# Patient Record
Sex: Female | Born: 1935 | Race: White | Hispanic: No | Smoking: Current every day smoker
Health system: Southern US, Community
[De-identification: ages and names within clinical notes are randomized; demographics above are authoritative.]

## PROBLEM LIST (undated history)

## (undated) DIAGNOSIS — F329 Major depressive disorder, single episode, unspecified: Secondary | ICD-10-CM

## (undated) DIAGNOSIS — M549 Dorsalgia, unspecified: Secondary | ICD-10-CM

## (undated) DIAGNOSIS — F32A Depression, unspecified: Secondary | ICD-10-CM

## (undated) DIAGNOSIS — M5136 Other intervertebral disc degeneration, lumbar region: Secondary | ICD-10-CM

## (undated) DIAGNOSIS — J449 Chronic obstructive pulmonary disease, unspecified: Secondary | ICD-10-CM

## (undated) DIAGNOSIS — G8929 Other chronic pain: Secondary | ICD-10-CM

## (undated) DIAGNOSIS — M419 Scoliosis, unspecified: Secondary | ICD-10-CM

## (undated) HISTORY — PX: COLONOSCOPY WITH ESOPHAGOGASTRODUODENOSCOPY (EGD) AND ESOPHAGEAL DILATION (ED): SHX6495

## (undated) HISTORY — PX: CERVICAL SPINE SURGERY: SHX589

---

## 2018-01-26 ENCOUNTER — Encounter (HOSPITAL_COMMUNITY): Payer: Self-pay | Admitting: Emergency Medicine

## 2018-01-26 ENCOUNTER — Inpatient Hospital Stay (HOSPITAL_COMMUNITY)
Admission: EM | Admit: 2018-01-26 | Discharge: 2018-02-02 | DRG: 374 | Disposition: A | Discharge status: Skilled Nursing Facility | Payer: Medicare Other | Attending: Internal Medicine | Admitting: Internal Medicine

## 2018-01-26 ENCOUNTER — Emergency Department (HOSPITAL_COMMUNITY): Payer: Medicare Other

## 2018-01-26 ENCOUNTER — Other Ambulatory Visit: Payer: Self-pay

## 2018-01-26 DIAGNOSIS — K222 Esophageal obstruction: Secondary | ICD-10-CM | POA: Diagnosis present

## 2018-01-26 DIAGNOSIS — R6881 Early satiety: Secondary | ICD-10-CM | POA: Diagnosis present

## 2018-01-26 DIAGNOSIS — Z885 Allergy status to narcotic agent status: Secondary | ICD-10-CM

## 2018-01-26 DIAGNOSIS — J441 Chronic obstructive pulmonary disease with (acute) exacerbation: Secondary | ICD-10-CM | POA: Diagnosis present

## 2018-01-26 DIAGNOSIS — J449 Chronic obstructive pulmonary disease, unspecified: Secondary | ICD-10-CM | POA: Insufficient documentation

## 2018-01-26 DIAGNOSIS — R634 Abnormal weight loss: Secondary | ICD-10-CM | POA: Diagnosis present

## 2018-01-26 DIAGNOSIS — R188 Other ascites: Secondary | ICD-10-CM

## 2018-01-26 DIAGNOSIS — R1319 Other dysphagia: Secondary | ICD-10-CM

## 2018-01-26 DIAGNOSIS — R109 Unspecified abdominal pain: Secondary | ICD-10-CM | POA: Diagnosis present

## 2018-01-26 DIAGNOSIS — K319 Disease of stomach and duodenum, unspecified: Secondary | ICD-10-CM | POA: Diagnosis present

## 2018-01-26 DIAGNOSIS — K859 Acute pancreatitis without necrosis or infection, unspecified: Secondary | ICD-10-CM | POA: Diagnosis not present

## 2018-01-26 DIAGNOSIS — C786 Secondary malignant neoplasm of retroperitoneum and peritoneum: Secondary | ICD-10-CM | POA: Diagnosis not present

## 2018-01-26 DIAGNOSIS — G8929 Other chronic pain: Secondary | ICD-10-CM | POA: Diagnosis present

## 2018-01-26 DIAGNOSIS — R809 Proteinuria, unspecified: Secondary | ICD-10-CM | POA: Diagnosis present

## 2018-01-26 DIAGNOSIS — R1314 Dysphagia, pharyngoesophageal phase: Secondary | ICD-10-CM | POA: Diagnosis present

## 2018-01-26 DIAGNOSIS — Z9181 History of falling: Secondary | ICD-10-CM

## 2018-01-26 DIAGNOSIS — K769 Liver disease, unspecified: Secondary | ICD-10-CM | POA: Diagnosis present

## 2018-01-26 DIAGNOSIS — M5136 Other intervertebral disc degeneration, lumbar region: Secondary | ICD-10-CM | POA: Diagnosis present

## 2018-01-26 DIAGNOSIS — R1310 Dysphagia, unspecified: Secondary | ICD-10-CM

## 2018-01-26 DIAGNOSIS — R180 Malignant ascites: Secondary | ICD-10-CM

## 2018-01-26 DIAGNOSIS — E871 Hypo-osmolality and hyponatremia: Secondary | ICD-10-CM | POA: Diagnosis present

## 2018-01-26 DIAGNOSIS — M549 Dorsalgia, unspecified: Secondary | ICD-10-CM

## 2018-01-26 DIAGNOSIS — K5900 Constipation, unspecified: Secondary | ICD-10-CM | POA: Diagnosis present

## 2018-01-26 DIAGNOSIS — C180 Malignant neoplasm of cecum: Secondary | ICD-10-CM | POA: Diagnosis present

## 2018-01-26 DIAGNOSIS — K449 Diaphragmatic hernia without obstruction or gangrene: Secondary | ICD-10-CM | POA: Diagnosis present

## 2018-01-26 DIAGNOSIS — E860 Dehydration: Secondary | ICD-10-CM | POA: Diagnosis present

## 2018-01-26 DIAGNOSIS — Z720 Tobacco use: Secondary | ICD-10-CM | POA: Diagnosis present

## 2018-01-26 DIAGNOSIS — Z79899 Other long term (current) drug therapy: Secondary | ICD-10-CM

## 2018-01-26 DIAGNOSIS — F329 Major depressive disorder, single episode, unspecified: Secondary | ICD-10-CM | POA: Diagnosis present

## 2018-01-26 DIAGNOSIS — F17210 Nicotine dependence, cigarettes, uncomplicated: Secondary | ICD-10-CM | POA: Diagnosis present

## 2018-01-26 DIAGNOSIS — F32A Depression, unspecified: Secondary | ICD-10-CM | POA: Diagnosis present

## 2018-01-26 DIAGNOSIS — C801 Malignant (primary) neoplasm, unspecified: Secondary | ICD-10-CM

## 2018-01-26 HISTORY — DX: Dorsalgia, unspecified: M54.9

## 2018-01-26 HISTORY — DX: Other chronic pain: G89.29

## 2018-01-26 HISTORY — DX: Chronic obstructive pulmonary disease, unspecified: J44.9

## 2018-01-26 HISTORY — DX: Other intervertebral disc degeneration, lumbar region: M51.36

## 2018-01-26 HISTORY — DX: Major depressive disorder, single episode, unspecified: F32.9

## 2018-01-26 HISTORY — DX: Depression, unspecified: F32.A

## 2018-01-26 HISTORY — DX: Scoliosis, unspecified: M41.9

## 2018-01-26 LAB — COMPREHENSIVE METABOLIC PANEL
ALBUMIN: 3 g/dL — AB (ref 3.5–5.0)
ALK PHOS: 82 U/L (ref 38–126)
ALT: 12 U/L (ref 0–44)
AST: 19 U/L (ref 15–41)
Anion gap: 8 (ref 5–15)
BILIRUBIN TOTAL: 0.4 mg/dL (ref 0.3–1.2)
BUN: 20 mg/dL (ref 8–23)
CALCIUM: 9.1 mg/dL (ref 8.9–10.3)
CO2: 30 mmol/L (ref 22–32)
Chloride: 95 mmol/L — ABNORMAL LOW (ref 98–111)
Creatinine, Ser: 0.68 mg/dL (ref 0.44–1.00)
GFR calc Af Amer: >60 mL/min (ref >60)
GFR calc non Af Amer: >60 mL/min (ref >60)
GLUCOSE: 95 mg/dL (ref 70–99)
Potassium: 4.3 mmol/L (ref 3.5–5.1)
Sodium: 133 mmol/L — ABNORMAL LOW (ref 135–145)
TOTAL PROTEIN: 6.8 g/dL (ref 6.5–8.1)

## 2018-01-26 LAB — URINALYSIS, ROUTINE W REFLEX MICROSCOPIC
BACTERIA UA: NONE SEEN
Bilirubin Urine: NEGATIVE
GLUCOSE, UA: NEGATIVE mg/dL
HGB URINE DIPSTICK: NEGATIVE
Ketones, ur: NEGATIVE mg/dL
Leukocytes, UA: NEGATIVE
NITRITE: NEGATIVE
PROTEIN: 30 mg/dL — AB
Specific Gravity, Urine: 1.017 (ref 1.005–1.030)
pH: 7 (ref 5.0–8.0)

## 2018-01-26 LAB — CBC
HCT: 34.2 % — ABNORMAL LOW (ref 36.0–46.0)
HEMOGLOBIN: 11.1 g/dL — AB (ref 12.0–15.0)
MCH: 28.5 pg (ref 26.0–34.0)
MCHC: 32.5 g/dL (ref 30.0–36.0)
MCV: 87.9 fL (ref 78.0–100.0)
Platelets: 553 10*3/uL — ABNORMAL HIGH (ref 150–400)
RBC: 3.89 MIL/uL (ref 3.87–5.11)
RDW: 13.6 % (ref 11.5–15.5)
WBC: 10.5 10*3/uL (ref 4.0–10.5)

## 2018-01-26 LAB — MAGNESIUM: Magnesium: 2.3 mg/dL (ref 1.7–2.4)

## 2018-01-26 LAB — PHOSPHORUS: PHOSPHORUS: 3.4 mg/dL (ref 2.5–4.6)

## 2018-01-26 LAB — LIPASE, BLOOD: Lipase: 263 U/L — ABNORMAL HIGH (ref 11–51)

## 2018-01-26 MED ORDER — MORPHINE SULFATE (PF) 4 MG/ML IV SOLN
INTRAVENOUS | Status: AC
  Filled 2018-01-26: qty 1

## 2018-01-26 MED ORDER — HEPARIN SODIUM (PORCINE) 5000 UNIT/ML IJ SOLN
5000.0000 [IU] | Freq: Three times a day (TID) | INTRAMUSCULAR | Status: DC
  Administered 2018-01-26 – 2018-01-27 (×3): 5000 [IU] via SUBCUTANEOUS
  Filled 2018-01-26 (×3): qty 1

## 2018-01-26 MED ORDER — SODIUM CHLORIDE 0.9 % IV BOLUS
1000.0000 mL | Freq: Once | INTRAVENOUS | Status: AC
  Administered 2018-01-26: 1000 mL via INTRAVENOUS

## 2018-01-26 MED ORDER — ACETAMINOPHEN 325 MG PO TABS: 650.0000 mg | ORAL_TABLET | Freq: Four times a day (QID) | ORAL | Status: DC | PRN

## 2018-01-26 MED ORDER — IPRATROPIUM-ALBUTEROL 0.5-2.5 (3) MG/3ML IN SOLN
3.0000 mL | Freq: Once | RESPIRATORY_TRACT | Status: AC
  Administered 2018-01-26: 3 mL via RESPIRATORY_TRACT
  Filled 2018-01-26: qty 3

## 2018-01-26 MED ORDER — MORPHINE SULFATE (PF) 2 MG/ML IV SOLN
2.0000 mg | INTRAVENOUS | Status: DC | PRN
  Administered 2018-01-27 – 2018-01-31 (×13): 2 mg via INTRAVENOUS
  Filled 2018-01-26 (×14): qty 1

## 2018-01-26 MED ORDER — FAMOTIDINE IN NACL 20-0.9 MG/50ML-% IV SOLN
20.0000 mg | Freq: Two times a day (BID) | INTRAVENOUS | Status: DC
  Administered 2018-01-26 – 2018-01-28 (×4): 20 mg via INTRAVENOUS
  Filled 2018-01-26 (×4): qty 50

## 2018-01-26 MED ORDER — SODIUM CHLORIDE 0.9 % IV SOLN
INTRAVENOUS | Status: DC
  Administered 2018-01-26 – 2018-01-30 (×7): via INTRAVENOUS

## 2018-01-26 MED ORDER — METHYLPREDNISOLONE SODIUM SUCC 40 MG IJ SOLR
40.0000 mg | Freq: Once | INTRAMUSCULAR | Status: AC
  Administered 2018-01-27: 40 mg via INTRAVENOUS
  Filled 2018-01-26: qty 1

## 2018-01-26 MED ORDER — IOPAMIDOL (ISOVUE-300) INJECTION 61%
100.0000 mL | Freq: Once | INTRAVENOUS | Status: AC | PRN
  Administered 2018-01-26: 100 mL via INTRAVENOUS

## 2018-01-26 MED ORDER — ONDANSETRON HCL 4 MG/2ML IJ SOLN
4.0000 mg | Freq: Once | INTRAMUSCULAR | Status: AC
Start: 2018-01-26 — End: 2018-01-26
  Administered 2018-01-26: 4 mg via INTRAVENOUS
  Filled 2018-01-26: qty 2

## 2018-01-26 MED ORDER — MORPHINE SULFATE (PF) 4 MG/ML IV SOLN
4.0000 mg | Freq: Once | INTRAVENOUS | Status: AC
  Administered 2018-01-26: 4 mg via INTRAVENOUS
  Filled 2018-01-26: qty 1

## 2018-01-26 MED ORDER — ONDANSETRON HCL 4 MG/2ML IJ SOLN: 4.0000 mg | Freq: Three times a day (TID) | INTRAMUSCULAR | Status: DC | PRN

## 2018-01-26 MED ORDER — ACETAMINOPHEN 650 MG RE SUPP: 650.0000 mg | Freq: Four times a day (QID) | RECTAL | Status: DC | PRN

## 2018-01-26 NOTE — ED Provider Notes (Signed)
[facility] EMERGENCY DEPARTMENT Provider Note   CSN: [id number] Arrival date & time: 01/26/18  1421     History   Chief Complaint Chief Complaint  Patient presents with  . Abdominal Pain    HPI Christy Valdez is a 82 y.o. female.  She is complaining of 1 month of poor appetite and weight loss.  Her daughter states she is lost about 20 pounds in the month.  Over the last 1 week she is had central abdominal pain with indigestion.  She rates the pain as 9 out of 10 at its usually more right upper although it is diffuse and radiates into her back.  She is had one episode of vomiting and her stools are usually more constipated and she is been taking laxatives for that.  She has not noticed blood from either end.  She is got a chronic cough from COPD and continues to smoke.  She denies any chest pain and is been no urinary symptoms.  She denies any alcohol use.  The history is provided by the patient and a relative.  Abdominal Pain   This is a new problem. The current episode started more than 1 week ago. The problem occurs constantly. The problem has not changed since onset.The pain is associated with eating. The pain is located in the generalized abdominal region and RUQ. The quality of the pain is aching. The pain is at a severity of 9/10. Associated symptoms include anorexia, belching, nausea, vomiting and constipation. Pertinent negatives include fever, diarrhea, flatus, hematochezia, melena, dysuria, frequency, hematuria and headaches. The symptoms are aggravated by eating. Nothing relieves the symptoms.    Past Medical History:  Diagnosis Date  . Chronic back pain   . COPD (chronic obstructive pulmonary disease) ([place])   . DDD (degenerative disc disease), lumbar   . Depression   . Scoliosis     There are no active problems to display for this patient.   History reviewed. No pertinent surgical history.   OB History   None      Home Medications    Prior to Admission  medications   Not on File    Family History History reviewed. No pertinent family history.  Social History Social History   Tobacco Use  . Smoking status: Current Every Day Smoker    Packs/day: 1.00    Types: Cigarettes  . Smokeless tobacco: Never Used  Substance Use Topics  . Alcohol use: Never    Frequency: Never  . Drug use: Never     Allergies   Patient has no known allergies.   Review of Systems Review of Systems  Constitutional: Negative for fever.  HENT: Negative for sore throat.   Eyes: Negative for visual disturbance.  Respiratory: Negative for shortness of breath.   Cardiovascular: Negative for chest pain.  Gastrointestinal: Positive for abdominal pain, anorexia, constipation, nausea and vomiting. Negative for diarrhea, flatus, hematochezia and melena.  Genitourinary: Negative for dysuria, frequency and hematuria.  Musculoskeletal: Positive for back pain. Negative for neck pain.  Skin: Negative for rash.  Neurological: Negative for headaches.     Physical Exam Updated Vital Signs BP (!) 190/83 (BP Location: Left Arm)   Pulse 68   Temp 98.4 F (36.9 C) (Oral)   Resp 16   Ht 5\' 2"  (1.575 m)   Wt 36.3 kg (80 lb)   SpO2 98%   BMI 14.63 kg/m   Physical Exam  Constitutional: She appears well-developed and well-nourished. No distress.  HENT:  Head:  Normocephalic and atraumatic.  Eyes: Conjunctivae are normal.  Neck: Neck supple.  Cardiovascular: Normal rate, regular rhythm, normal heart sounds and intact distal pulses.  No murmur heard. Pulmonary/Chest: Effort normal and breath sounds normal. No respiratory distress.  Abdominal: Soft. There is generalized tenderness and tenderness in the right upper quadrant. There is no rigidity and no guarding.  Musculoskeletal: She exhibits no edema, tenderness or deformity.  Neurological: She is alert.  Skin: Skin is warm and dry. Capillary refill takes less than 2 seconds.  Psychiatric: She has a normal mood  and affect.  Nursing note and vitals reviewed.    ED Treatments / Results  Labs (all labs ordered are listed, but only abnormal results are displayed) Labs Reviewed  URINALYSIS, ROUTINE W REFLEX MICROSCOPIC - Abnormal; Notable for the following components:      Result Value   Protein, ur 30 (*)    All other components within normal limits  CBC - Abnormal; Notable for the following components:   Hemoglobin 11.1 (*)    HCT 34.2 (*)    Platelets 553 (*)    All other components within normal limits  COMPREHENSIVE METABOLIC PANEL - Abnormal; Notable for the following components:   Sodium 133 (*)    Chloride 95 (*)    Albumin 3.0 (*)    All other components within normal limits  LIPASE, BLOOD - Abnormal; Notable for the following components:   Lipase 263 (*)    All other components within normal limits    EKG EKG Interpretation  Date/Time:  Monday January 26 2018 22:39:48 EDT Ventricular Rate:  71 PR Interval:    QRS Duration: 83 QT Interval:  377 QTC Calculation: 410 R Axis:   74 Text Interpretation:  Sinus rhythm Borderline repolarization abnormality no prior to compare with Confirmed by Aletta Edouard [phone]) on 01/26/2018 10:53:27 PM   Radiology Ct Abdomen Pelvis W Contrast  Result Date: 01/26/2018 CLINICAL DATA:  20 pound weight loss over the last month. Central abdominal pain and digestion. EXAM: CT ABDOMEN AND PELVIS WITH CONTRAST TECHNIQUE: Multidetector CT imaging of the abdomen and pelvis was performed using the standard protocol following bolus administration of intravenous contrast. CONTRAST:  1101mL ISOVUE-300 IOPAMIDOL (ISOVUE-300) INJECTION 61% COMPARISON:  None. FINDINGS: Lower chest: Small to moderate-sized hiatal hernia.  Cachexia. Hepatobiliary: 4 mm hypodense lesion in the lateral segment left hepatic lobe on image 13/2. 6 mm hypodense lesion in the right hepatic lobe on image 18/2. Probable tiny gallstone in the gallbladder, 2 mm in diameter on image 26/2.  Borderline gallbladder wall thickening. Mild periportal edema.  No biliary dilatation. Pancreas: Upper normal caliber of the dorsal pancreatic duct. Otherwise unremarkable. Spleen: Unremarkable Adrenals/Urinary Tract: Adrenal glands unremarkable. 7 mm hypodense lesion of the right mid kidney, likely a benign cyst but technically too small to characterize. No appreciable hydronephrosis or urinary tract calculus. Stomach/Bowel: The bowel is suspended in prominent ascites. Prominent wall thickening in the sigmoid colon, tumor not excluded. Vascular/Lymphatic: Aortoiliac atherosclerotic vascular disease. Reproductive: Ill definition of the uterus, query hysterectomy. Ovaries indistinct. Other: Prominent ascites. Enhancing and some calcified margins of the pelvic portion of the ascites posteriorly for example on images 55-61 of series 2. Suspected tumor deposition along the left upper quadrant omentum. Probable thick tumor throughout the transverse colon and splenic flexure mesentery. Musculoskeletal: Dextroconvex lumbar scoliosis. Lumbar spondylosis and degenerative disc disease including a notable disc protrusion at the L3-4 level. Degenerative arthropathy of both hips. IMPRESSION: 1. Prominent ascites with enhancing margins especially  posteriorly in the pelvis, and suspected omental and mesenteric caking of tumor in the left upper quadrant and along the transverse colon mesentery. Appearance highly suspicious for malignancy, common etiologies would be from mucinous colon cancer or ovarian cancer. The ovaries are poorly visualized. Peritonitis is a less likely differential diagnostic consideration given the overall appearance. Correlate with serologic tumor markers. 2. Marked wall thickening in the sigmoid colon could be from diverticulosis or tumor. 3. Cachexia. 4. Other imaging findings of potential clinical significance: Small to moderate-sized hiatal hernia. Possible gallstone. Several tiny hepatic lesions are  technically nonspecific. Mild periportal edema. Aortic Atherosclerosis (ICD10-I70.0). Dextroconvex lumbar scoliosis. Lumbar spondylosis and degenerative disc disease. Electronically Signed   By: Van Clines M.D.   On: 01/26/2018 21:38   US Abdomen Limited Ruq  Result Date: 01/27/2018 CLINICAL DATA:  Pancreatitis, 20 pounds weight loss EXAM: ULTRASOUND ABDOMEN LIMITED RIGHT UPPER QUADRANT COMPARISON:  CT abdomen and pelvis 01/26/2018 FINDINGS: Gallbladder: Upper normal gallbladder wall thickness. Tiny probable gallbladder polyp 3 mm diameter. No definite shadowing calculi, wall thickening, or sonographic Murphy sign. Common bile duct: Diameter: 4 mm diameter, normal Liver: Normal echogenicity. No definite hepatic mass or nodularity. Portal vein is patent on color Doppler imaging with normal direction of blood flow towards the liver. Significant ascites. Noted RIGHT pleural effusion. IMPRESSION: Significant ascites and small RIGHT pleural effusion. Tiny gallbladder polyp 3 mm diameter without definite gallstones or biliary dilatation. Electronically Signed   By: Lavonia Dana M.D.   On: 01/27/2018 10:28    Procedures Procedures (including critical care time)  Medications Ordered in ED Medications - No data to display   Initial Impression / Assessment and Plan / ED Course  I have reviewed the triage vital signs and the nursing notes.  Pertinent labs & imaging results that were available during my care of the patient were reviewed by me and considered in my medical decision making (see chart for details).  Clinical Course as of Jan 27 1045  Mon Jul 15, 368  [id number] 82 year old female with 1 month of decreased appetite and weight loss.  1 week of diffuse abdominal pain.  Here with elevated lipase of 263.  The rest of her lab work looks fairly benign right now.  She is pending a CT abdomen.   [MB]    Clinical Course User Index [MB] Hayden Rasmussen, MD   Patient CT was concerning for  possible carcinomatosis in the setting of ascites.  Due to her pain and her inability to take good p.o. I felt that she should be admitted to the hospital for management of these.  I reviewed this with the patient and let her know my concerns about the CAT scan findings.  I discussed this also with Dr. Olevia Bowens from the hospitalist service who will admit her to his service. Final Clinical Impressions(s) / ED Diagnoses   Final diagnoses:  Other ascites  Acute pancreatitis without infection or necrosis, unspecified pancreatitis type  Loss of weight    ED Discharge Orders    None       Hayden Rasmussen, MD 01/27/18 1050

## 2018-01-26 NOTE — ED Triage Notes (Signed)
Patient complaining of abdominal pain "for over a week." States she has been constipated and only had small bowel movements in the past week.

## 2018-01-26 NOTE — H&P (Signed)
History and Physical    Christy Valdez WLN:[id number] DOB: 05-12-1936 DOA: 01/26/2018  PCP: Quentin Cornwall, MD   Patient coming from: Home.  I have personally briefly reviewed patient's old medical records in [place]  Chief Complaint: Abdominal pain.  HPI: Christy Valdez is a 82 y.o. female with medical history significant of chronic back pain, COPD, scoliosis, lumbar DDD, depression who is coming to the emergency department with complaints of abdominal pain for the past 3 weeks.  She states that she had one episode of vomiting last week.  She states that she lost 20 pounds in the past 2 years and her poor appetite also started 2 years ago, however the patient's daughter stated earlier to Dr. Melina Copa that she has had very poor appetite and lost 20 pounds in the past month.  She has been having trouble with constipation and her last bowel movement was yesterday.  She has to use over-the-counter laxatives to induce BM.  She denies diarrhea, melena or hematochezia.  No dysuria, frequency or hematuria.  She also has had cough, wheezing and dyspnea recently.  Denies fever, chills, sore throat, hemoptysis, chest pain, palpitations, dizziness, diaphoresis, PND, orthopnea or pitting edema lower extremities.  No heat or cold intolerance.  No polyuria, polydipsia, polyphagia or blurred vision.  Denies skin pruritus or rashes.  ED Course: She will vital signs temperature 98.4 F, pulse 103, respirations 18, blood pressure 152/81 mmHg and O2 sat 93% on room air.  The patient received 4 mg of morphine IVP x1 and ondansetron 4 mg IVP x1.  She is stated that her pain has decreased from 9 out of 10 to 5 out of 10.  When asked, the patient declined further analgesic treatment at that time.  Work-up shows a urinalysis with proteinuria of 30 mg/dL, but no other finding.  Her white count was 10.5, hemoglobin 11.1 g/dL and platelets 553.  Sodium is 133 and chloride 95 mmol/L.  Her albumin is 3.0 g/dL.  All  other CMP values are within normal limits.  Her lipase was elevated at 263 units/L.  Imaging: CT abdomen/pelvis with contrast showed prominent mastitis with enhancing margins especially posteriorly in the pelvis, and suspecting omental and mesenteric caking of tumor in the left upper quadrant and along the transverse colon mesentery.  This is highly suspicious for malignancy and common etiologies would be mucinous colon CA or ovarian cyst cancer.  Ovaries are poorly visualized.  Peritonitis is less likely differential diagnosis given the overall appearance.  There was a possible gallstone.  Correlate with serologic tumor markers.  Review of Systems: As per HPI otherwise 10 point review of systems negative.   Past Medical History:  Diagnosis Date  . Chronic back pain   . COPD (chronic obstructive pulmonary disease) ([place])   . DDD (degenerative disc disease), lumbar   . Depression   . Scoliosis     History reviewed. No pertinent surgical history.   reports that she has been smoking cigarettes.  She has been smoking about 1.00 pack per day. She has never used smokeless tobacco. She reports that she does not drink alcohol or use drugs.  Allergies  Allergen Reactions  . Codeine Nausea And Vomiting   Family History  Problem Relation Age of Onset  . AAA (abdominal aortic aneurysm) Mother   . CVA Father   . Cancer Sister        Unspecified type.  Marland Kitchen CAD Brother        Died of  MI  . Breast cancer Sister   . CAD Brother     Prior to Admission medications   Medication Sig Start Date End Date Taking? Authorizing Provider  albuterol (PROVENTIL) (2.5 MG/3ML) 0.083% nebulizer solution Take 2.5 mg by nebulization every 6 (six) hours as needed for wheezing or shortness of breath.  11/02/17  Yes [provider]  amoxicillin (AMOXIL) 500 MG capsule TAKE ONE CAPSULE BY MOUTH TWICE DAILY FOR 10 DAYS 01/20/18  Yes [provider]  citalopram (CELEXA) 40 MG tablet Take 40 mg by mouth  daily.  12/09/17  Yes [provider]  pramipexole (MIRAPEX) 0.125 MG tablet Take 0.125 mg by mouth at bedtime.  11/02/17  Yes [provider]  traMADol (ULTRAM) 50 MG tablet Take 50 mg by mouth 3 (three) times daily as needed. FOR PAIN 12/09/17  Yes [provider]  SPIRIVA HANDIHALER 18 MCG inhalation capsule Place 18 mcg into inhaler and inhale daily.  11/04/17   [provider]    Physical Exam: Vitals:   01/26/18 1930 01/26/18 2000 01/26/18 2100 01/26/18 2257  BP: (!) 170/69 (!) 195/70 (!) 183/97 (!) 170/86  Pulse: 64 68 72   Resp:    16  Temp:      TempSrc:      SpO2: 95% (!) 89% 94% 92%  Weight:      Height:        Constitutional: Cachectic, but in NAD, calm, comfortable Eyes: PERRL, lids and conjunctivae normal ENMT: Mucous membranes are moist. Posterior pharynx clear of any exudate or lesions. Neck: normal, supple, no masses, no thyromegaly Respiratory: Decreased breath sounds bilaterally with wheezing and mild rhonchi, no crackles. Normal respiratory effort. No accessory muscle use.  Cardiovascular: Regular rate and rhythm, no murmurs / rubs / gallops. No extremity edema. 2+ pedal pulses. No carotid bruits.  Abdomen: Abdomen feels tense, but not distended.  Mildly tympanic.  Positive epigastric, RUQ, RLQ and suprapubic tenderness, no guarding/rebound/masses palpated. No hepatosplenomegaly. Bowel sounds positive.  Musculoskeletal: no clubbing / cyanosis. Good ROM, no contractures. Normal muscle tone.  Skin: Positive ecchymosis areas on forearms.  Positive skin telangiectasis on ankles and pretibial area. Neurologic: CN 2-12 grossly intact. Sensation intact, DTR normal. Strength 5/5 in all 4.  Psychiatric: Alert and oriented x 3. Normal mood.    Labs on Admission: I have personally reviewed following labs and imaging studies  CBC: Recent Labs  Lab 01/26/18 1645  WBC 10.5  HGB 11.1*  HCT 34.2*  MCV 87.9  PLT 010*   Basic Metabolic  Panel: Recent Labs  Lab 01/26/18 1645  NA 133*  K 4.3  CL 95*  CO2 30  GLUCOSE 95  BUN 20  CREATININE 0.68  CALCIUM 9.1  MG 2.3  PHOS 3.4   GFR: Estimated Creatinine Clearance: 32.1 mL/min (by C-G formula based on SCr of 0.68 mg/dL). Liver Function Tests: Recent Labs  Lab 01/26/18 1645  AST 19  ALT 12  ALKPHOS 82  BILITOT 0.4  PROT 6.8  ALBUMIN 3.0*   Recent Labs  Lab 01/26/18 1645  LIPASE 263*   No results for input(s): AMMONIA in the last 168 hours. Coagulation Profile: No results for input(s): INR, PROTIME in the last 168 hours. Cardiac Enzymes: No results for input(s): CKTOTAL, CKMB, CKMBINDEX, TROPONINI in the last 168 hours. BNP (last 3 results) No results for input(s): PROBNP in the last 8760 hours. HbA1C: No results for input(s): HGBA1C in the last 72 hours. CBG: No results for input(s):  GLUCAP in the last 168 hours. Lipid Profile: No results for input(s): CHOL, HDL, LDLCALC, TRIG, CHOLHDL, LDLDIRECT in the last 72 hours. Thyroid Function Tests: No results for input(s): TSH, T4TOTAL, FREET4, T3FREE, THYROIDAB in the last 72 hours. Anemia Panel: No results for input(s): VITAMINB12, FOLATE, FERRITIN, TIBC, IRON, RETICCTPCT in the last 72 hours. Urine analysis:    Component Value Date/Time   COLORURINE YELLOW 01/26/2018 1430   APPEARANCEUR CLEAR 01/26/2018 1430   LABSPEC 1.017 01/26/2018 1430   PHURINE 7.0 01/26/2018 1430   GLUCOSEU NEGATIVE 01/26/2018 1430   HGBUR NEGATIVE 01/26/2018 1430   BILIRUBINUR NEGATIVE 01/26/2018 1430   KETONESUR NEGATIVE 01/26/2018 1430   PROTEINUR 30 (A) 01/26/2018 1430   NITRITE NEGATIVE 01/26/2018 1430   LEUKOCYTESUR NEGATIVE 01/26/2018 1430    Radiological Exams on Admission: Ct Abdomen Pelvis W Contrast  Result Date: 01/26/2018 CLINICAL DATA:  20 pound weight loss over the last month. Central abdominal pain and digestion. EXAM: CT ABDOMEN AND PELVIS WITH CONTRAST TECHNIQUE: Multidetector CT imaging of the  abdomen and pelvis was performed using the standard protocol following bolus administration of intravenous contrast. CONTRAST:  169mL ISOVUE-300 IOPAMIDOL (ISOVUE-300) INJECTION 61% COMPARISON:  None. FINDINGS: Lower chest: Small to moderate-sized hiatal hernia.  Cachexia. Hepatobiliary: 4 mm hypodense lesion in the lateral segment left hepatic lobe on image 13/2. 6 mm hypodense lesion in the right hepatic lobe on image 18/2. Probable tiny gallstone in the gallbladder, 2 mm in diameter on image 26/2. Borderline gallbladder wall thickening. Mild periportal edema.  No biliary dilatation. Pancreas: Upper normal caliber of the dorsal pancreatic duct. Otherwise unremarkable. Spleen: Unremarkable Adrenals/Urinary Tract: Adrenal glands unremarkable. 7 mm hypodense lesion of the right mid kidney, likely a benign cyst but technically too small to characterize. No appreciable hydronephrosis or urinary tract calculus. Stomach/Bowel: The bowel is suspended in prominent ascites. Prominent wall thickening in the sigmoid colon, tumor not excluded. Vascular/Lymphatic: Aortoiliac atherosclerotic vascular disease. Reproductive: Ill definition of the uterus, query hysterectomy. Ovaries indistinct. Other: Prominent ascites. Enhancing and some calcified margins of the pelvic portion of the ascites posteriorly for example on images 55-61 of series 2. Suspected tumor deposition along the left upper quadrant omentum. Probable thick tumor throughout the transverse colon and splenic flexure mesentery. Musculoskeletal: Dextroconvex lumbar scoliosis. Lumbar spondylosis and degenerative disc disease including a notable disc protrusion at the L3-4 level. Degenerative arthropathy of both hips. IMPRESSION: 1. Prominent ascites with enhancing margins especially posteriorly in the pelvis, and suspected omental and mesenteric caking of tumor in the left upper quadrant and along the transverse colon mesentery. Appearance highly suspicious for  malignancy, common etiologies would be from mucinous colon cancer or ovarian cancer. The ovaries are poorly visualized. Peritonitis is a less likely differential diagnostic consideration given the overall appearance. Correlate with serologic tumor markers. 2. Marked wall thickening in the sigmoid colon could be from diverticulosis or tumor. 3. Cachexia. 4. Other imaging findings of potential clinical significance: Small to moderate-sized hiatal hernia. Possible gallstone. Several tiny hepatic lesions are technically nonspecific. Mild periportal edema. Aortic Atherosclerosis (ICD10-I70.0). Dextroconvex lumbar scoliosis. Lumbar spondylosis and degenerative disc disease. Electronically Signed   By: Van Clines M.D.   On: 01/26/2018 21:38    EKG: Independently reviewed.  Vent. rate 71 BPM PR interval * ms QRS duration 83 ms QT/QTc 377/410 ms P-R-T axes 78 74 91 Sinus rhythm Borderline repolarization abnormality. No previous tracing to compare to.  Assessment/Plan Principal Problem:   Intractable abdominal pain Observation/MedSurg. Continue IV fluids. Continue antiemetic  as needed. Continue analgesics as needed. Check CEA and CA 125 levels. Consult oncology in a.m. May need IR for tissue and/or fluid sampling.  Active Problems:   Acute pancreatitis Continue management as above. Check lipase level in AM. Check RUQ [place] in the morning.    Chronic back pain Hold tramadol while getting parenteral analgesics.    COPD exacerbation ([place]) Started on supplemental oxygen. DuoNeb every 6 hours. Single dose Solu-Medrol 40 mg IVP given.    Depression Continue citalopram 40 mg p.o. daily.    Tobacco use Declined nicotine replacement therapy. Staff to provide smoking cessation information.    Hyponatremia Continue normal saline infusion. Follow-up sodium level.    DVT prophylaxis: Heparin SQ. Code Status: Full code. Family Communication: Disposition Plan: Observation for pain  control and further work-up. Consults called: Routine oncology consult Admission status: Observation/MedSurg.   Reubin Milan MD [facility] Pager [phone].  If 7PM-7AM, please contact night-coverage [email or web address] Password [facility]  01/26/2018, 11:05 PM

## 2018-01-26 NOTE — ED Notes (Signed)
EDP at bedside  

## 2018-01-27 ENCOUNTER — Encounter (HOSPITAL_COMMUNITY): Payer: Self-pay | Admitting: Gastroenterology

## 2018-01-27 ENCOUNTER — Observation Stay (HOSPITAL_COMMUNITY): Payer: Medicare Other

## 2018-01-27 DIAGNOSIS — Z720 Tobacco use: Secondary | ICD-10-CM | POA: Diagnosis not present

## 2018-01-27 DIAGNOSIS — R1310 Dysphagia, unspecified: Secondary | ICD-10-CM | POA: Diagnosis not present

## 2018-01-27 DIAGNOSIS — R180 Malignant ascites: Secondary | ICD-10-CM | POA: Diagnosis present

## 2018-01-27 DIAGNOSIS — Z885 Allergy status to narcotic agent status: Secondary | ICD-10-CM | POA: Diagnosis not present

## 2018-01-27 DIAGNOSIS — C801 Malignant (primary) neoplasm, unspecified: Secondary | ICD-10-CM | POA: Diagnosis not present

## 2018-01-27 DIAGNOSIS — R1314 Dysphagia, pharyngoesophageal phase: Secondary | ICD-10-CM | POA: Diagnosis present

## 2018-01-27 DIAGNOSIS — C180 Malignant neoplasm of cecum: Secondary | ICD-10-CM | POA: Diagnosis present

## 2018-01-27 DIAGNOSIS — M5136 Other intervertebral disc degeneration, lumbar region: Secondary | ICD-10-CM | POA: Diagnosis present

## 2018-01-27 DIAGNOSIS — R109 Unspecified abdominal pain: Secondary | ICD-10-CM | POA: Diagnosis not present

## 2018-01-27 DIAGNOSIS — R188 Other ascites: Secondary | ICD-10-CM | POA: Diagnosis not present

## 2018-01-27 DIAGNOSIS — K222 Esophageal obstruction: Secondary | ICD-10-CM | POA: Diagnosis present

## 2018-01-27 DIAGNOSIS — F17210 Nicotine dependence, cigarettes, uncomplicated: Secondary | ICD-10-CM | POA: Diagnosis present

## 2018-01-27 DIAGNOSIS — K449 Diaphragmatic hernia without obstruction or gangrene: Secondary | ICD-10-CM | POA: Diagnosis present

## 2018-01-27 DIAGNOSIS — Z79899 Other long term (current) drug therapy: Secondary | ICD-10-CM | POA: Diagnosis not present

## 2018-01-27 DIAGNOSIS — R634 Abnormal weight loss: Secondary | ICD-10-CM | POA: Diagnosis not present

## 2018-01-27 DIAGNOSIS — C786 Secondary malignant neoplasm of retroperitoneum and peritoneum: Secondary | ICD-10-CM | POA: Diagnosis not present

## 2018-01-27 DIAGNOSIS — K859 Acute pancreatitis without necrosis or infection, unspecified: Secondary | ICD-10-CM | POA: Diagnosis present

## 2018-01-27 DIAGNOSIS — K5900 Constipation, unspecified: Secondary | ICD-10-CM | POA: Diagnosis present

## 2018-01-27 DIAGNOSIS — G8929 Other chronic pain: Secondary | ICD-10-CM | POA: Diagnosis present

## 2018-01-27 DIAGNOSIS — J441 Chronic obstructive pulmonary disease with (acute) exacerbation: Secondary | ICD-10-CM | POA: Diagnosis present

## 2018-01-27 DIAGNOSIS — K769 Liver disease, unspecified: Secondary | ICD-10-CM | POA: Diagnosis present

## 2018-01-27 DIAGNOSIS — Z9181 History of falling: Secondary | ICD-10-CM | POA: Diagnosis not present

## 2018-01-27 DIAGNOSIS — E860 Dehydration: Secondary | ICD-10-CM | POA: Diagnosis present

## 2018-01-27 DIAGNOSIS — F329 Major depressive disorder, single episode, unspecified: Secondary | ICD-10-CM | POA: Diagnosis present

## 2018-01-27 DIAGNOSIS — R6881 Early satiety: Secondary | ICD-10-CM | POA: Diagnosis present

## 2018-01-27 DIAGNOSIS — K319 Disease of stomach and duodenum, unspecified: Secondary | ICD-10-CM | POA: Diagnosis present

## 2018-01-27 DIAGNOSIS — R809 Proteinuria, unspecified: Secondary | ICD-10-CM | POA: Diagnosis present

## 2018-01-27 DIAGNOSIS — E871 Hypo-osmolality and hyponatremia: Secondary | ICD-10-CM | POA: Diagnosis present

## 2018-01-27 LAB — CBC WITH DIFFERENTIAL/PLATELET
BASOS ABS: 0 10*3/uL (ref 0.0–0.1)
BASOS PCT: 0 %
EOS ABS: 0 10*3/uL (ref 0.0–0.7)
EOS PCT: 0 %
HEMATOCRIT: 34.5 % — AB (ref 36.0–46.0)
Hemoglobin: 10.8 g/dL — ABNORMAL LOW (ref 12.0–15.0)
LYMPHS PCT: 6 %
Lymphs Abs: 0.6 10*3/uL — ABNORMAL LOW (ref 0.7–4.0)
MCH: 27.7 pg (ref 26.0–34.0)
MCHC: 31.3 g/dL (ref 30.0–36.0)
MCV: 88.5 fL (ref 78.0–100.0)
MONO ABS: 0.1 10*3/uL (ref 0.1–1.0)
Monocytes Relative: 1 %
Neutro Abs: 8.9 10*3/uL — ABNORMAL HIGH (ref 1.7–7.7)
Neutrophils Relative %: 93 %
Platelets: 516 10*3/uL — ABNORMAL HIGH (ref 150–400)
RBC: 3.9 MIL/uL (ref 3.87–5.11)
RDW: 13.6 % (ref 11.5–15.5)
WBC: 9.5 10*3/uL (ref 4.0–10.5)

## 2018-01-27 LAB — COMPREHENSIVE METABOLIC PANEL
ALBUMIN: 2.5 g/dL — AB (ref 3.5–5.0)
ALK PHOS: 77 U/L (ref 38–126)
ALT: 10 U/L (ref 0–44)
ANION GAP: 8 (ref 5–15)
AST: 16 U/L (ref 15–41)
BILIRUBIN TOTAL: 0.3 mg/dL (ref 0.3–1.2)
BUN: 15 mg/dL (ref 8–23)
CO2: 28 mmol/L (ref 22–32)
CREATININE: 0.58 mg/dL (ref 0.44–1.00)
Calcium: 8.4 mg/dL — ABNORMAL LOW (ref 8.9–10.3)
Chloride: 100 mmol/L (ref 98–111)
GFR calc non Af Amer: >60 mL/min (ref >60)
GLUCOSE: 103 mg/dL — AB (ref 70–99)
Potassium: 4.7 mmol/L (ref 3.5–5.1)
SODIUM: 136 mmol/L (ref 135–145)
TOTAL PROTEIN: 5.8 g/dL — AB (ref 6.5–8.1)

## 2018-01-27 LAB — LIPASE, BLOOD: Lipase: 66 U/L — ABNORMAL HIGH (ref 11–51)

## 2018-01-27 MED ORDER — ENSURE ENLIVE PO LIQD: 237.0000 mL | Freq: Four times a day (QID) | ORAL | Status: DC

## 2018-01-27 MED ORDER — CITALOPRAM HYDROBROMIDE 20 MG PO TABS
40.0000 mg | ORAL_TABLET | Freq: Every day | ORAL | Status: DC
  Administered 2018-01-27 – 2018-02-02 (×7): 40 mg via ORAL
  Filled 2018-01-27 (×7): qty 2

## 2018-01-27 MED ORDER — BOOST / RESOURCE BREEZE PO LIQD CUSTOM
1.0000 | Freq: Three times a day (TID) | ORAL | Status: DC
  Administered 2018-01-27: 1 via ORAL

## 2018-01-27 MED ORDER — TIOTROPIUM BROMIDE MONOHYDRATE 18 MCG IN CAPS
18.0000 ug | ORAL_CAPSULE | Freq: Every day | RESPIRATORY_TRACT | Status: DC
  Administered 2018-01-27 – 2018-01-31 (×5): 18 ug via RESPIRATORY_TRACT
  Filled 2018-01-27: qty 5

## 2018-01-27 MED ORDER — BOOST / RESOURCE BREEZE PO LIQD CUSTOM
1.0000 | Freq: Three times a day (TID) | ORAL | Status: DC
  Administered 2018-01-28 – 2018-02-01 (×14): 1 via ORAL

## 2018-01-27 MED ORDER — PRAMIPEXOLE DIHYDROCHLORIDE 0.25 MG PO TABS
0.1250 mg | ORAL_TABLET | Freq: Every day | ORAL | Status: DC
  Administered 2018-01-27 – 2018-02-01 (×6): 0.125 mg via ORAL
  Filled 2018-01-27 (×2): qty 0.5
  Filled 2018-01-27: qty 1
  Filled 2018-01-27 (×3): qty 0.5
  Filled 2018-01-27: qty 1
  Filled 2018-01-27 (×2): qty 0.5

## 2018-01-27 MED ORDER — AMOXICILLIN 250 MG PO CAPS: 500.0000 mg | ORAL_CAPSULE | Freq: Two times a day (BID) | ORAL | Status: DC

## 2018-01-27 MED ORDER — HYDRALAZINE HCL 20 MG/ML IJ SOLN
10.0000 mg | INTRAMUSCULAR | Status: DC | PRN
  Administered 2018-01-27 – 2018-01-30 (×4): 10 mg via INTRAVENOUS
  Filled 2018-01-27 (×4): qty 1

## 2018-01-27 MED ORDER — ALBUTEROL SULFATE (2.5 MG/3ML) 0.083% IN NEBU
2.5000 mg | INHALATION_SOLUTION | Freq: Four times a day (QID) | RESPIRATORY_TRACT | Status: DC | PRN
  Administered 2018-01-30: 2.5 mg via RESPIRATORY_TRACT
  Filled 2018-01-27: qty 3

## 2018-01-27 NOTE — Progress Notes (Signed)
Initial Nutrition Assessment  DOCUMENTATION CODES:  Underweight (highly suspected to be severely malnourished, but unable to perform exam at this time)  INTERVENTION:  While limited to CL diet, Boost Breeze po TID, each supplement provides 250 kcal and 9 grams of protein  Will follow up upon diet advancement and also conduct physical assessment to dx severe malnutrition.   NUTRITION DIAGNOSIS:  Inadequate oral intake related to poor appetite as evidenced by per patient/family report.  GOAL:  Patient will meet greater than or equal to 90% of their needs  MONITOR:  PO intake, Supplement acceptance, Diet advancement, Weight trends, Labs  REASON FOR ASSESSMENT:  Malnutrition Screening Tool    ASSESSMENT:  82 y/o female PMHx Chronic back pain, COPD, DDD, Depression. Presented w/ report of abdominal pain x3 weeks and one episode of vomiting last week. Daughter state pt has had a poor appetite and lost 20 lbs x1 month. Also with constipation. 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 Admitted for further workup.  Pt is HOH and a poor historian. There are several friends at bedside, but they cannot offer any nutrition related history.   Patient is very vague. She says she was not eating well PTA and has "lost much right weight". She gives a UBW of 117 lbs. The bed weight today is 83 lbs, about same as admit weight. There is no weight history in chart. There is no weight history in chart to elucidate when she began to lose weight or the rate at which she lost it.   At home, she says she would drink 3 Boost supplements each day. Regarding dysphagia, she just said she trys to not each large portions so that she wont get choked.   At this time, patient is tolerating the CL diet. She is agreeable to supplementing with Boost Breeze. Will order TID.   NFPE: Deferred at this time due to presence of numerous non-relatives- Almost  assuredly she will meet severe malnutrition criteria once a physical exam can be performed  Labs: Albumin: 2.5 Meds: IVF, IV Pepcid, PRN pain medication   Recent Labs  Lab 01/26/18 1645 01/27/18 0413  NA 133* 136  K 4.3 4.7  CL 95* 100  CO2 30 28  BUN 20 15  CREATININE 0.68 0.58  CALCIUM 9.1 8.4*  MG 2.3  --   PHOS 3.4  --   GLUCOSE 95 103*   NUTRITION - FOCUSED PHYSICAL EXAM: Deferred at this time  Diet Order:   Diet Order           Diet clear liquid Room service appropriate? Yes; Fluid consistency: Thin  Diet effective now         EDUCATION NEEDS:  No education needs have been identified at this time  Skin:  Skin Assessment: Reviewed RN Assessment  Last BM:  7/13  Height:  Ht Readings from Last 1 Encounters:  01/26/18 5\' 2"  (1.575 m)   Weight:  Wt Readings from Last 1 Encounters:  01/26/18 80 lb (36.3 kg)   Wt Readings from Last 10 Encounters:  01/26/18 80 lb (36.3 kg)   Ideal Body Weight:  50 kg  BMI:  Body mass index is 14.63 kg/m.  Estimated Nutritional Needs:  Kcal:  >1450 kcals (40 kcal/kg bw) Protein:  >73g (2g/kg bw) Fluid:  .9-1.1 L fluid (25-30 ml/kg)  Burtis Junes RD, LDN, CNSC Clinical Nutrition Available Tues-Sat via Pager: [phone] 01/27/2018 2:01 PM

## 2018-01-27 NOTE — Progress Notes (Addendum)
PROGRESS NOTE    Sireen Halk  TML:[id number] DOB: 06-01-1936 DOA: 01/26/2018 PCP: Quentin Cornwall, MD   Brief Narrative:   Jeniffer Culliver is a 82 y.o. female with medical history significant of chronic back pain, COPD, scoliosis, lumbar DDD, depression who is coming to the emergency department with complaints of abdominal pain for the past 3 weeks.  She has had very poor appetite and has lost 20 pounds recently.  She also complains of significant dysphasia and has had prior history of esophageal strictures and dilations.  She is noted to have a possible tumor burden in her abdominal cavity with omental caking noted.  Assessment & Plan:   Principal Problem:   Intractable abdominal pain Active Problems:   Chronic back pain   Depression   Tobacco use   Hyponatremia   COPD exacerbation ([place])   1. Intractable generalized abdominal pain with associated weight loss and anorexia.  Suspect that much of this may be related to an oncologic process with high suspicion for ovarian versus colon cancer in differential.  Await CEA and CA 125 levels and consider IR for tissue sampling if biopsies are not obtained on endoscopy.  GI consulted for likely need for upper as well as lower endoscopy.  Patient will also likely require esophageal dilation due to dysphagia.  Continue on clear liquid diet for now.  Hold off on oncology consultation until further studies result. 2. Lipase elevation-likely associated with above processes.  Patient does not clinically present with pancreatitis at this time.  Continue on clear liquid diet and gentle IV fluid hydration. 3. Mild hyponatremia.  Improved with normal saline infusion.  This is likely due to some mild dehydration with poor oral intake. 4. Chronic lumbago.  Continue parenteral analgesics. 5. History of COPD.  Duo nebs as needed every 6 hours.  No signs of exacerbation currently noted. 6. Depression.  Continue citalopram 40 mg p.o. daily. 7. Tobacco abuse.   Smoking cessation counseling. Refused nicotine replacement.   DVT prophylaxis: Heparin Code Status: Full Family Communication: None at bedside Disposition Plan: Further evaluation per GI with likely need for endoscopy.  Await CEA and CA 125 levels.   Consultants:   GI  Procedures:   None  Antimicrobials:   None   Subjective: Patient seen and evaluated today with no new acute complaints or concerns. No acute concerns or events noted overnight since admission.  She complains of significant dysphagia to solids and liquids that has been gradually worsening.  She states that her last endoscopy was over 5 years ago when she required dilation at that time.  Objective: Vitals:   01/27/18 0030 01/27/18 0110 01/27/18 0501 01/27/18 0919  BP: (!) 165/72 102/88 (!) 152/68   Pulse: 65 66 62   Resp: 14 16 18    Temp:  98.7 F (37.1 C) 98.3 F (36.8 C)   TempSrc:  Oral Oral   SpO2: 99% 95% 98% 92%  Weight:      Height:        Intake/Output Summary (Last 24 hours) at 01/27/2018 1011 Last data filed at 01/27/2018 0400 Gross per 24 hour  Intake 1361.46 ml  Output -  Net 1361.46 ml   Filed Weights   01/26/18 1427  Weight: 36.3 kg (80 lb)    Examination:  General exam: Appears calm and comfortable, hard of hearing Respiratory system: Clear to auscultation. Respiratory effort normal. Cardiovascular system: S1 & S2 heard, RRR. No JVD, murmurs, rubs, gallops or clicks. No pedal edema. Gastrointestinal system:  Abdomen is  Minimally distended, soft and nontender. No organomegaly or masses felt. Normal bowel sounds heard. Central nervous system: Alert and oriented. No focal neurological deficits. Extremities: Symmetric 5 x 5 power. Skin: No rashes, lesions or ulcers    Data Reviewed: I have personally reviewed following labs and imaging studies  CBC: Recent Labs  Lab 01/26/18 1645 01/27/18 0413  WBC 10.5 9.5  NEUTROABS  --  8.9*  HGB 11.1* 10.8*  HCT 34.2* 34.5*  MCV 87.9  88.5  PLT 553* 419*   Basic Metabolic Panel: Recent Labs  Lab 01/26/18 1645 01/27/18 0413  NA 133* 136  K 4.3 4.7  CL 95* 100  CO2 30 28  GLUCOSE 95 103*  BUN 20 15  CREATININE 0.68 0.58  CALCIUM 9.1 8.4*  MG 2.3  --   PHOS 3.4  --    GFR: Estimated Creatinine Clearance: 32.1 mL/min (by C-G formula based on SCr of 0.58 mg/dL). Liver Function Tests: Recent Labs  Lab 01/26/18 1645 01/27/18 0413  AST 19 16  ALT 12 10  ALKPHOS 82 77  BILITOT 0.4 0.3  PROT 6.8 5.8*  ALBUMIN 3.0* 2.5*   Recent Labs  Lab 01/26/18 1645 01/27/18 0413  LIPASE 263* 66*   No results for input(s): AMMONIA in the last 168 hours. Coagulation Profile: No results for input(s): INR, PROTIME in the last 168 hours. Cardiac Enzymes: No results for input(s): CKTOTAL, CKMB, CKMBINDEX, TROPONINI in the last 168 hours. BNP (last 3 results) No results for input(s): PROBNP in the last 8760 hours. HbA1C: No results for input(s): HGBA1C in the last 72 hours. CBG: No results for input(s): GLUCAP in the last 168 hours. Lipid Profile: No results for input(s): CHOL, HDL, LDLCALC, TRIG, CHOLHDL, LDLDIRECT in the last 72 hours. Thyroid Function Tests: No results for input(s): TSH, T4TOTAL, FREET4, T3FREE, THYROIDAB in the last 72 hours. Anemia Panel: No results for input(s): VITAMINB12, FOLATE, FERRITIN, TIBC, IRON, RETICCTPCT in the last 72 hours. Sepsis Labs: No results for input(s): PROCALCITON, LATICACIDVEN in the last 168 hours.  No results found for this or any previous visit (from the past 240 hour(s)).       Radiology Studies: Ct Abdomen Pelvis W Contrast  Result Date: 01/26/2018 CLINICAL DATA:  20 pound weight loss over the last month. Central abdominal pain and digestion. EXAM: CT ABDOMEN AND PELVIS WITH CONTRAST TECHNIQUE: Multidetector CT imaging of the abdomen and pelvis was performed using the standard protocol following bolus administration of intravenous contrast. CONTRAST:  159mL  ISOVUE-300 IOPAMIDOL (ISOVUE-300) INJECTION 61% COMPARISON:  None. FINDINGS: Lower chest: Small to moderate-sized hiatal hernia.  Cachexia. Hepatobiliary: 4 mm hypodense lesion in the lateral segment left hepatic lobe on image 13/2. 6 mm hypodense lesion in the right hepatic lobe on image 18/2. Probable tiny gallstone in the gallbladder, 2 mm in diameter on image 26/2. Borderline gallbladder wall thickening. Mild periportal edema.  No biliary dilatation. Pancreas: Upper normal caliber of the dorsal pancreatic duct. Otherwise unremarkable. Spleen: Unremarkable Adrenals/Urinary Tract: Adrenal glands unremarkable. 7 mm hypodense lesion of the right mid kidney, likely a benign cyst but technically too small to characterize. No appreciable hydronephrosis or urinary tract calculus. Stomach/Bowel: The bowel is suspended in prominent ascites. Prominent wall thickening in the sigmoid colon, tumor not excluded. Vascular/Lymphatic: Aortoiliac atherosclerotic vascular disease. Reproductive: Ill definition of the uterus, query hysterectomy. Ovaries indistinct. Other: Prominent ascites. Enhancing and some calcified margins of the pelvic portion of the ascites posteriorly for example on images 55-61  of series 2. Suspected tumor deposition along the left upper quadrant omentum. Probable thick tumor throughout the transverse colon and splenic flexure mesentery. Musculoskeletal: Dextroconvex lumbar scoliosis. Lumbar spondylosis and degenerative disc disease including a notable disc protrusion at the L3-4 level. Degenerative arthropathy of both hips. IMPRESSION: 1. Prominent ascites with enhancing margins especially posteriorly in the pelvis, and suspected omental and mesenteric caking of tumor in the left upper quadrant and along the transverse colon mesentery. Appearance highly suspicious for malignancy, common etiologies would be from mucinous colon cancer or ovarian cancer. The ovaries are poorly visualized. Peritonitis is a less  likely differential diagnostic consideration given the overall appearance. Correlate with serologic tumor markers. 2. Marked wall thickening in the sigmoid colon could be from diverticulosis or tumor. 3. Cachexia. 4. Other imaging findings of potential clinical significance: Small to moderate-sized hiatal hernia. Possible gallstone. Several tiny hepatic lesions are technically nonspecific. Mild periportal edema. Aortic Atherosclerosis (ICD10-I70.0). Dextroconvex lumbar scoliosis. Lumbar spondylosis and degenerative disc disease. Electronically Signed   By: Van Clines M.D.   On: 01/26/2018 21:38        Scheduled Meds: . citalopram  40 mg Oral Daily  . heparin  5,000 Units Subcutaneous Q8H  . pramipexole  0.125 mg Oral QHS  . tiotropium  18 mcg Inhalation Daily   Continuous Infusions: . sodium chloride 62.5 mL/hr at 01/26/18 2301  . famotidine (PEPCID) IV Stopped (01/26/18 2338)     LOS: 0 days    Time spent: 30 minutes    Jannifer Fischler Darleen Crocker, DO [facility] Pager [phone]  If 7PM-7AM, please contact night-coverage [email or web address] Password [email or web address] 01/27/2018, 10:11 AM

## 2018-01-27 NOTE — Consult Note (Signed)
Referring Provider: Dr. Manuella Ghazi  Primary Care Physician:  Quentin Cornwall, MD Primary Gastroenterologist:  Previously Angelina Sheriff in remote past, patient does not remember name.   Date of Admission: 01/26/18 Date of Consultation: 01/27/18  Reason for Consultation:  Dysphagia   HPI:  Christy Valdez is an 82 y.o. year old female who presented to the ED yesterday with abdominal pain for the past 3 weeks. Weight loss reported. CT abd/pelvis with contrast on admission with prominent ascites with enhancing margins especially posteriorly in pelvis, suspected omental and mesenteric caking of tumor in LUQ and along the transverse colon mesentery suspicious for malignancy. Differentials colon or ovarian. Marked wall thickening in sigmoid colon. Pancreas was unremarkable with upper normal caliber of dorsal pancreatic duct. Indeterminate liver lesions. Lipase was 263 on admission, now 66.    RUQ pain, all over starting 3 weeks ago. Intermittent, waxing and waning in intensity. Decreased oral intake. Vomiting X 1. EGD/dilations in the past in [place]. Last in 2005 possibly. Colonoscopy in [place] around 2005. Small polyps and was told wouldn't need any more due to age. No overt GI bleeding. Has been dealing with constipation about a week. MOM this past week. Prior to this, no constipation. Appetite has been "gone". Notes early satiety. Gradually losing weight over past 2 years. [place] 2 years and appetite decreased. Feels it was correlated with accident when she fell. Weight was 117 about 2 years ago. No dysphagia with "small foods". No pill dysphagia. Sometimes has dry mouth and will hang in throat. Abdominal pain worse with exertion, pulling up in bed, etc. Feels somewhat improved since admission. Brother at bedside.   Past Medical History:  Diagnosis Date  . Chronic back pain   . COPD (chronic obstructive pulmonary disease) ([place])   . DDD (degenerative disc disease), lumbar   . Depression   . Scoliosis      Past Surgical History:  Procedure Laterality Date  . [place], anterior approach  . COLONOSCOPY WITH ESOPHAGOGASTRODUODENOSCOPY (EGD) AND ESOPHAGEAL DILATION (ED)     remote past, ?2005    Prior to Admission medications   Medication Sig Start Date End Date Taking? Authorizing Provider  albuterol (PROVENTIL) (2.5 MG/3ML) 0.083% nebulizer solution Take 2.5 mg by nebulization every 6 (six) hours as needed for wheezing or shortness of breath.  11/02/17  Yes [provider]  amoxicillin (AMOXIL) 500 MG capsule TAKE ONE CAPSULE BY MOUTH TWICE DAILY FOR 10 DAYS 01/20/18  Yes [provider]  citalopram (CELEXA) 40 MG tablet Take 40 mg by mouth daily.  12/09/17  Yes [provider]  pramipexole (MIRAPEX) 0.125 MG tablet Take 0.125 mg by mouth at bedtime.  11/02/17  Yes [provider]  traMADol (ULTRAM) 50 MG tablet Take 50 mg by mouth 3 (three) times daily as needed. FOR PAIN 12/09/17  Yes [provider]  SPIRIVA HANDIHALER 18 MCG inhalation capsule Place 18 mcg into inhaler and inhale daily.  11/04/17   [provider]    Current Facility-Administered Medications  Medication Dose Route Frequency Provider Last Rate Last Dose  . 0.9 %  sodium chloride infusion   Intravenous Continuous Reubin Milan, MD 62.5 mL/hr at 01/27/18 1106    . acetaminophen (TYLENOL) tablet 650 mg  650 mg Oral Q6H PRN Reubin Milan, MD       Or  . acetaminophen (TYLENOL) suppository 650 mg  650 mg Rectal Q6H PRN Reubin Milan, MD      .  albuterol (PROVENTIL) (2.5 MG/3ML) 0.083% nebulizer solution 2.5 mg  2.5 mg Nebulization Q6H PRN Manuella Ghazi, Pratik D, DO      . citalopram (CELEXA) tablet 40 mg  40 mg Oral Daily Reubin Milan, MD   40 mg at 01/27/18 1036  . famotidine (PEPCID) IVPB 20 mg premix  20 mg Intravenous Q12H Reubin Milan, MD   Stopped at 01/27/18 1106  . heparin injection 5,000 Units  5,000 Units Subcutaneous  Q8H Reubin Milan, MD   5,000 Units at 01/26/18 2302  . morphine 2 MG/ML injection 2 mg  2 mg Intravenous Q2H PRN Reubin Milan, MD   2 mg at 01/27/18 1044  . ondansetron (ZOFRAN) injection 4 mg  4 mg Intravenous Q8H PRN Reubin Milan, MD      . pramipexole (MIRAPEX) tablet 0.125 mg  0.125 mg Oral QHS Reubin Milan, MD      . tiotropium [facility]) inhalation capsule 18 mcg  18 mcg Inhalation Daily Reubin Milan, MD   18 mcg at 01/27/18 0919    Allergies as of 01/26/2018 - Review Complete 01/26/2018  Allergen Reaction Noted  . Codeine Nausea And Vomiting 12/08/2015    Family History  Problem Relation Age of Onset  . AAA (abdominal aortic aneurysm) Mother   . CVA Father   . Cancer Sister        Unspecified type.  Marland Kitchen CAD Brother        Died of MI  . Breast cancer Sister   . CAD Brother   . Colon cancer Neg Hx     Social History   Socioeconomic History  . Marital status: Married    Spouse name: Not on file  . Number of children: Not on file  . Years of education: Not on file  . Highest education level: Not on file  Occupational History  . Not on file  Social Needs  . Financial resource strain: Not on file  . Food insecurity:    Worry: Not on file    Inability: Not on file  . Transportation needs:    Medical: Not on file    Non-medical: Not on file  Tobacco Use  . Smoking status: Current Every Day Smoker    Packs/day: 1.00    Types: Cigarettes  . Smokeless tobacco: Never Used  Substance and Sexual Activity  . Alcohol use: Never    Frequency: Never  . Drug use: Never  . Sexual activity: Not on file  Lifestyle  . Physical activity:    Days per week: Not on file    Minutes per session: Not on file  . Stress: Not on file  Relationships  . Social connections:    Talks on phone: Not on file    Gets together: Not on file    Attends religious service: Not on file    Active member of club or organization: Not on file    Attends meetings of  clubs or organizations: Not on file    Relationship status: Not on file  . Intimate partner violence:    Fear of current or ex partner: Not on file    Emotionally abused: Not on file    Physically abused: Not on file    Forced sexual activity: Not on file  Other Topics Concern  . Not on file  Social History Narrative  . Not on file    Review of Systems: Gen: see HPI  CV: Denies chest pain, heart palpitations, syncope,  edema  Resp: Denies shortness of breath with rest, cough, wheezing GI: see HPI  GU : Denies urinary burning, urinary frequency, urinary incontinence.  MS: see HPI  Derm: Denies rash, itching, dry skin Psych: Denies depression, anxiety,confusion, or memory loss Heme: see HPI   Physical Exam: Vital signs in last 24 hours: Temp:  [98.3 F (36.8 C)-98.7 F (37.1 C)] 98.3 F (36.8 C) (07/16 0501) Pulse Rate:  [62-103] 62 (07/16 0501) Resp:  [14-20] 18 (07/16 0501) BP: (102-195)/(68-97) 152/68 (07/16 0501) SpO2:  [89 %-100 %] 92 % (07/16 0919) Weight:  [80 lb (36.3 kg)] 80 lb (36.3 kg) (07/15 1427) Last BM Date: 01/24/18 General:   Alert,  Thin, chronically-ill appearing. No distress  Head:  Normocephalic and atraumatic. Eyes:  Sclera clear, no icterus.   Conjunctiva pink. Ears:  Normal auditory acuity. Nose:  No deformity, discharge,  or lesions. Mouth:  No deformity or lesions Lungs:  Clear throughout to auscultation.  Heart:  S1 S2 present without murmurs  Abdomen:  Distended, moderately tense ascites, +BS, mild discomfort right-sided abdomen. No rebound or guarding. Rectal:  Deferred until time of colonoscopy.   Msk:  Symmetrical without gross deformities. Normal posture. Extremities:  Without  edema. Neurologic:  Alert and  oriented x4 Psych:  Alert and cooperative. Normal mood and affect.  Intake/Output from previous day: 07/15 0701 - 07/16 0700 In: 1361.5 [I.V.:311.5; IV Piggyback:1050] Out: -  Intake/Output this shift: Total I/O In: 462.5  [I.V.:412.5; IV Piggyback:50] Out: -   Lab Results: Recent Labs    01/26/18 1645 01/27/18 0413  WBC 10.5 9.5  HGB 11.1* 10.8*  HCT 34.2* 34.5*  PLT 553* 516*   BMET Recent Labs    01/26/18 1645 01/27/18 0413  NA 133* 136  K 4.3 4.7  CL 95* 100  CO2 30 28  GLUCOSE 95 103*  BUN 20 15  CREATININE 0.68 0.58  CALCIUM 9.1 8.4*   LFT Recent Labs    01/26/18 1645 01/27/18 0413  PROT 6.8 5.8*  ALBUMIN 3.0* 2.5*  AST 19 16  ALT 12 10  ALKPHOS 82 77  BILITOT 0.4 0.3    Studies/Results: Ct Abdomen Pelvis W Contrast  Result Date: 01/26/2018 CLINICAL DATA:  20 pound weight loss over the last month. Central abdominal pain and digestion. EXAM: CT ABDOMEN AND PELVIS WITH CONTRAST TECHNIQUE: Multidetector CT imaging of the abdomen and pelvis was performed using the standard protocol following bolus administration of intravenous contrast. CONTRAST:  121mL ISOVUE-300 IOPAMIDOL (ISOVUE-300) INJECTION 61% COMPARISON:  None. FINDINGS: Lower chest: Small to moderate-sized hiatal hernia.  Cachexia. Hepatobiliary: 4 mm hypodense lesion in the lateral segment left hepatic lobe on image 13/2. 6 mm hypodense lesion in the right hepatic lobe on image 18/2. Probable tiny gallstone in the gallbladder, 2 mm in diameter on image 26/2. Borderline gallbladder wall thickening. Mild periportal edema.  No biliary dilatation. Pancreas: Upper normal caliber of the dorsal pancreatic duct. Otherwise unremarkable. Spleen: Unremarkable Adrenals/Urinary Tract: Adrenal glands unremarkable. 7 mm hypodense lesion of the right mid kidney, likely a benign cyst but technically too small to characterize. No appreciable hydronephrosis or urinary tract calculus. Stomach/Bowel: The bowel is suspended in prominent ascites. Prominent wall thickening in the sigmoid colon, tumor not excluded. Vascular/Lymphatic: Aortoiliac atherosclerotic vascular disease. Reproductive: Ill definition of the uterus, query hysterectomy. Ovaries  indistinct. Other: Prominent ascites. Enhancing and some calcified margins of the pelvic portion of the ascites posteriorly for example on images 55-61 of series 2. Suspected tumor deposition  along the left upper quadrant omentum. Probable thick tumor throughout the transverse colon and splenic flexure mesentery. Musculoskeletal: Dextroconvex lumbar scoliosis. Lumbar spondylosis and degenerative disc disease including a notable disc protrusion at the L3-4 level. Degenerative arthropathy of both hips. IMPRESSION: 1. Prominent ascites with enhancing margins especially posteriorly in the pelvis, and suspected omental and mesenteric caking of tumor in the left upper quadrant and along the transverse colon mesentery. Appearance highly suspicious for malignancy, common etiologies would be from mucinous colon cancer or ovarian cancer. The ovaries are poorly visualized. Peritonitis is a less likely differential diagnostic consideration given the overall appearance. Correlate with serologic tumor markers. 2. Marked wall thickening in the sigmoid colon could be from diverticulosis or tumor. 3. Cachexia. 4. Other imaging findings of potential clinical significance: Small to moderate-sized hiatal hernia. Possible gallstone. Several tiny hepatic lesions are technically nonspecific. Mild periportal edema. Aortic Atherosclerosis (ICD10-I70.0). Dextroconvex lumbar scoliosis. Lumbar spondylosis and degenerative disc disease. Electronically Signed   By: Van Clines M.D.   On: 01/26/2018 21:38   US Abdomen Limited Ruq  Result Date: 01/27/2018 CLINICAL DATA:  Pancreatitis, 20 pounds weight loss EXAM: ULTRASOUND ABDOMEN LIMITED RIGHT UPPER QUADRANT COMPARISON:  CT abdomen and pelvis 01/26/2018 FINDINGS: Gallbladder: Upper normal gallbladder wall thickness. Tiny probable gallbladder polyp 3 mm diameter. No definite shadowing calculi, wall thickening, or sonographic Murphy sign. Common bile duct: Diameter: 4 mm diameter, normal  Liver: Normal echogenicity. No definite hepatic mass or nodularity. Portal vein is patent on color Doppler imaging with normal direction of blood flow towards the liver. Significant ascites. Noted RIGHT pleural effusion. IMPRESSION: Significant ascites and small RIGHT pleural effusion. Tiny gallbladder polyp 3 mm diameter without definite gallstones or biliary dilatation. Electronically Signed   By: Lavonia Dana M.D.   On: 01/27/2018 10:28    Impression: 82 year old female presenting with abdominal pain for three weeks, weight loss progressive over past several years, early satiety, and vague solid food dysphagia. CT with prominent ascites and suspected omental and mesenteric caking of tumor in left upper quadrant and along transverse colon mesentery, with concern for colon or ovarian origin. Marked wall thickening noted in sigmoid colon as well. Other findings as above. Although lipase elevated on admission, no CT findings of pancreatitis. She has had EGD/dilation in the remote past in [place] (?2005), and her last colonoscopy was around that time as well with reported polyps.  Anticipate need for colonoscopy  and EGD+/- dilation. Will keep her on clear liquids for now. CEA and CA 125 are in process. Oncology has not been consulted until further evaluation per GI as per hospitalist notes.   Plan: Continue clear liquids Will discuss further with Dr. Oneida Alar endoscopic procedures Will need oncology input in near future CEA and CA 125 pending   Annitta Needs, PhD, ANP-BC [facility]      LOS: 0 days    01/27/2018, 12:30 PM

## 2018-01-27 NOTE — Progress Notes (Signed)
Dr. Manuella Ghazi paged and made aware of BP 169/95 HR 57.

## 2018-01-28 ENCOUNTER — Inpatient Hospital Stay (HOSPITAL_COMMUNITY): Payer: Medicare Other

## 2018-01-28 ENCOUNTER — Encounter (HOSPITAL_COMMUNITY)
Admission: EM | Disposition: A | Discharge status: Skilled Nursing Facility | Payer: Self-pay | Source: Home / Self Care | Attending: Internal Medicine

## 2018-01-28 ENCOUNTER — Encounter (HOSPITAL_COMMUNITY): Payer: Self-pay

## 2018-01-28 ENCOUNTER — Other Ambulatory Visit: Payer: Self-pay

## 2018-01-28 DIAGNOSIS — Z720 Tobacco use: Secondary | ICD-10-CM

## 2018-01-28 DIAGNOSIS — C786 Secondary malignant neoplasm of retroperitoneum and peritoneum: Secondary | ICD-10-CM

## 2018-01-28 DIAGNOSIS — C801 Malignant (primary) neoplasm, unspecified: Secondary | ICD-10-CM

## 2018-01-28 DIAGNOSIS — K222 Esophageal obstruction: Secondary | ICD-10-CM

## 2018-01-28 DIAGNOSIS — R1310 Dysphagia, unspecified: Secondary | ICD-10-CM

## 2018-01-28 HISTORY — PX: ESOPHAGOGASTRODUODENOSCOPY: SHX5428

## 2018-01-28 LAB — COMPREHENSIVE METABOLIC PANEL
ALBUMIN: 2.6 g/dL — AB (ref 3.5–5.0)
ALT: 10 U/L (ref 0–44)
AST: 20 U/L (ref 15–41)
Alkaline Phosphatase: 77 U/L (ref 38–126)
Anion gap: 8 (ref 5–15)
BUN: 15 mg/dL (ref 8–23)
CALCIUM: 8.5 mg/dL — AB (ref 8.9–10.3)
CHLORIDE: 102 mmol/L (ref 98–111)
CO2: 26 mmol/L (ref 22–32)
Creatinine, Ser: 0.59 mg/dL (ref 0.44–1.00)
GFR calc Af Amer: >60 mL/min (ref >60)
GFR calc non Af Amer: >60 mL/min (ref >60)
GLUCOSE: 91 mg/dL (ref 70–99)
Potassium: 3.9 mmol/L (ref 3.5–5.1)
SODIUM: 136 mmol/L (ref 135–145)
Total Bilirubin: 0.3 mg/dL (ref 0.3–1.2)
Total Protein: 5.8 g/dL — ABNORMAL LOW (ref 6.5–8.1)

## 2018-01-28 LAB — AMYLASE, PLEURAL OR PERITONEAL FLUID: Amylase, Fluid: 29 U/L

## 2018-01-28 LAB — GRAM STAIN: Gram Stain: NONE SEEN

## 2018-01-28 LAB — BODY FLUID CELL COUNT WITH DIFFERENTIAL
EOS FL: 0 %
Lymphs, Fluid: 91 %
MONOCYTE-MACROPHAGE-SEROUS FLUID: 1 % — AB (ref 50–90)
NEUTROPHIL FLUID: 8 % (ref 0–25)
OTHER CELLS FL: REACTIVE %
Total Nucleated Cell Count, Fluid: 1072 cu mm — ABNORMAL HIGH (ref 0–1000)

## 2018-01-28 LAB — PROTEIN, PLEURAL OR PERITONEAL FLUID: Total protein, fluid: 3.4 g/dL

## 2018-01-28 LAB — GLUCOSE, PLEURAL OR PERITONEAL FLUID: GLUCOSE FL: 80 mg/dL

## 2018-01-28 LAB — CA 125: Cancer Antigen (CA) 125: 91 U/mL — ABNORMAL HIGH (ref 0.0–38.1)

## 2018-01-28 LAB — LACTATE DEHYDROGENASE, PLEURAL OR PERITONEAL FLUID: LD FL: 265 U/L — AB (ref 3–23)

## 2018-01-28 LAB — CBC
HCT: 35.9 % — ABNORMAL LOW (ref 36.0–46.0)
Hemoglobin: 11.5 g/dL — ABNORMAL LOW (ref 12.0–15.0)
MCH: 27.9 pg (ref 26.0–34.0)
MCHC: 32 g/dL (ref 30.0–36.0)
MCV: 87.1 fL (ref 78.0–100.0)
Platelets: 611 10*3/uL — ABNORMAL HIGH (ref 150–400)
RBC: 4.12 MIL/uL (ref 3.87–5.11)
RDW: 13.6 % (ref 11.5–15.5)
WBC: 12 10*3/uL — ABNORMAL HIGH (ref 4.0–10.5)

## 2018-01-28 LAB — ALBUMIN, PLEURAL OR PERITONEAL FLUID: Albumin, Fluid: 1.6 g/dL

## 2018-01-28 LAB — LIPASE, BLOOD: LIPASE: 40 U/L (ref 11–51)

## 2018-01-28 LAB — CEA: CEA1: 6.2 ng/mL — AB (ref 0.0–4.7)

## 2018-01-28 SURGERY — EGD (ESOPHAGOGASTRODUODENOSCOPY)
Anesthesia: Moderate Sedation

## 2018-01-28 MED ORDER — LIDOCAINE VISCOUS HCL 2 % MT SOLN
OROMUCOSAL | Status: AC
  Filled 2018-01-28: qty 15

## 2018-01-28 MED ORDER — LIDOCAINE VISCOUS HCL 2 % MT SOLN
OROMUCOSAL | Status: DC | PRN
  Administered 2018-01-28: 1 via OROMUCOSAL

## 2018-01-28 MED ORDER — MIDAZOLAM HCL 5 MG/5ML IJ SOLN
INTRAMUSCULAR | Status: DC | PRN
  Administered 2018-01-28 (×2): 0.5 mg via INTRAVENOUS

## 2018-01-28 MED ORDER — MEPERIDINE HCL 100 MG/ML IJ SOLN
INTRAMUSCULAR | Status: DC | PRN
  Administered 2018-01-28 (×2): 12.5 mg via INTRAVENOUS

## 2018-01-28 MED ORDER — SODIUM CHLORIDE 0.9 % IV SOLN
2.0000 g | INTRAVENOUS | Status: DC
  Administered 2018-01-28 – 2018-02-01 (×5): 2 g via INTRAVENOUS
  Filled 2018-01-28 (×2): qty 2
  Filled 2018-01-28: qty 20
  Filled 2018-01-28 (×2): qty 2
  Filled 2018-01-28 (×2): qty 20

## 2018-01-28 MED ORDER — FAMOTIDINE 20 MG PO TABS
20.0000 mg | ORAL_TABLET | Freq: Every day | ORAL | Status: DC
  Administered 2018-01-29 – 2018-02-02 (×5): 20 mg via ORAL
  Filled 2018-01-28 (×5): qty 1

## 2018-01-28 MED ORDER — ONDANSETRON HCL 4 MG/2ML IJ SOLN
INTRAMUSCULAR | Status: AC
  Filled 2018-01-28: qty 2

## 2018-01-28 MED ORDER — MIDAZOLAM HCL 5 MG/5ML IJ SOLN
INTRAMUSCULAR | Status: AC
  Filled 2018-01-28: qty 5

## 2018-01-28 MED ORDER — MEPERIDINE HCL 50 MG/ML IJ SOLN
INTRAMUSCULAR | Status: AC
  Filled 2018-01-28: qty 1

## 2018-01-28 MED ORDER — SODIUM CHLORIDE 0.9 % IV SOLN
INTRAVENOUS | Status: DC
  Administered 2018-01-28: 15:00:00 via INTRAVENOUS

## 2018-01-28 MED ORDER — PANTOPRAZOLE SODIUM 40 MG PO TBEC
40.0000 mg | DELAYED_RELEASE_TABLET | Freq: Every day | ORAL | Status: DC
  Administered 2018-01-28 – 2018-02-02 (×6): 40 mg via ORAL
  Filled 2018-01-28 (×7): qty 1

## 2018-01-28 NOTE — Progress Notes (Signed)
Patient is seen in endoscopy. Stable for EGD with esophageal dilation as feasible/appropriate.  The risks, benefits, limitations, alternatives and imponderables have been reviewed with the patient. Potential for esophageal dilation, biopsy, etc. have also been reviewed.  Questions have been answered. All parties agreeable.  Further recommendations to follow.

## 2018-01-28 NOTE — Progress Notes (Signed)
Returned from paracentesis and abd much softer . Dressing to right abd dry and intact.

## 2018-01-28 NOTE — Op Note (Signed)
[facility] Patient Name: Christy Valdez Procedure Date: 01/28/2018 2:54 PM MRN: [id number] Date of Birth: 10/12/1935 Attending MD: Norvel Richards , MD CSN: [id number] Age: 82 Admit Type: Inpatient Procedure:                Upper GI endoscopy Indications:              Dysphagia Providers:                Norvel Richards, MD, Janeece Riggers, RN, Randa Spike, Technician Referring MD:              Medicines:                Midazolam 1 mg IV, Meperidine 25 mg IV, Ondansetron                            4 mg IV Complications:            No immediate complications. Estimated Blood Loss:     Estimated blood loss was minimal. Procedure:                Pre-Anesthesia Assessment:                           - Prior to the procedure, a History and Physical                            was performed, and patient medications and                            allergies were reviewed. The patient's tolerance of                            previous anesthesia was also reviewed. The risks                            and benefits of the procedure and the sedation                            options and risks were discussed with the patient.                            All questions were answered, and informed consent                            was obtained. Prior Anticoagulants: The patient has                            taken no previous anticoagulant or antiplatelet                            agents. ASA Grade Assessment: II - A patient with  mild systemic disease. After reviewing the risks                            and benefits, the patient was deemed in                            satisfactory condition to undergo the procedure.                           After obtaining informed consent, the endoscope was                            passed under direct vision. Throughout the                            procedure, the patient's blood pressure,  pulse, and                            oxygen saturations were monitored continuously. The                            GIF-H190 ([id number]) scope was introduced through the                            mouth, and advanced to the second part of duodenum. Scope In: 3:36:32 PM Scope Out: 3:43:07 PM Total Procedure Duration: 0 hours 6 minutes 35 seconds  Findings:      A moderate Schatzki ring was found at the gastroesophageal junction.      A small hiatal hernia was present.      The exam was otherwise without abnormality.      The duodenal bulb and second portion of the duodenum were normal. The       scope was withdrawn. Dilation was performed with a Maloney dilator with       mild resistance at 41 Fr. The dilation site was examined following       endoscope reinsertion and showed moderate mucosal disruption. Estimated       blood loss was minimal. Impression:               - Moderate Schatzki ring. Dilated.                           - Small hiatal hernia.                           - The examination was otherwise normal.                           - Normal duodenal bulb and second portion of the                            duodenum.                           - No specimens collected. Moderate Sedation:      Moderate (conscious) sedation was administered by the endoscopy nurse  and supervised by the endoscopist. The following parameters were       monitored: oxygen saturation, heart rate, blood pressure, respiratory       rate, EKG, adequacy of pulmonary ventilation, and response to care.       Total physician intraservice time was 14 minutes. Recommendation:           - Patient has a contact number available for                            emergencies. The signs and symptoms of potential                            delayed complications were discussed with the                            patient. Return to normal activities tomorrow.                            Written discharge instructions  were provided to the                            patient.                           - dysphagia 2 diet. Protonix 40 mg orally daily.                           - Continue present medications.                           - No repeat upper endoscopy.                           - Return to GI office PRN. Procedure Code(s):        --- Professional ---                           [phone], Esophagogastroduodenoscopy, flexible,                            transoral; diagnostic, including collection of                            specimen(s) by brushing or washing, when performed                            (separate procedure)                           43450, Dilation of esophagus, by unguided sound or                            bougie, single or multiple passes                           G0500, Moderate sedation services provided by the  same physician or other qualified health care                            professional performing a gastrointestinal                            endoscopic service that sedation supports,                            requiring the presence of an independent trained                            observer to assist in the monitoring of the                            patient's level of consciousness and physiological                            status; initial 15 minutes of intra-service time;                            patient age 21 years or older (additional time may                            be reported with [phone], as appropriate) Diagnosis Code(s):        --- Professional ---                           K22.2, Esophageal obstruction                           K44.9, Diaphragmatic hernia without obstruction or                            gangrene                           R13.10, Dysphagia, unspecified CPT copyright 2017 American Medical Association. All rights reserved. The codes documented in this report are preliminary and upon coder review may  be  revised to meet current compliance requirements. Cristopher Estimable. Rourk, MD Norvel Richards, MD 01/28/2018 3:55:27 PM This report has been signed electronically. Number of Addenda: 0

## 2018-01-28 NOTE — Progress Notes (Signed)
2.2 L of peritoneal removed today via paracentesis.  White blood cell count 1072, 8% neutrophils.  Not consistent with SBP.  Pathology pending.  Plans for EGD with possible dilation later today.  Laureen Ochs. Bernarda Caffey [facility] [phone] 7/17/20191:23 PM

## 2018-01-28 NOTE — Procedures (Signed)
PreOperative Dx: Peritoneal carcinomatosis Postoperative Dx: Peritoneal carcinomatosis Procedure:   US guided paracentesis Radiologist:  Thornton Papas Anesthesia:  10 ml of1% lidocaine Specimen:  2.2 L of yellow ascitic fluid EBL:   < 1 ml Complications: None

## 2018-01-28 NOTE — Progress Notes (Signed)
PROGRESS NOTE                                                                                                                                                                                                             Patient Demographics:    Christy Valdez, is a 82 y.o. female, DOB - 1935-11-11, TWS:[id number]  Admit date - 01/26/2018   Admitting Physician Reubin Milan, MD  Outpatient Primary MD for the patient is Quentin Cornwall, MD  LOS - 1  Outpatient Specialists: GI  Chief Complaint  Patient presents with  . Abdominal Pain       Brief Narrative 82 year old female with history of chronic back pain, COPD with ongoing tobacco use, lumbar DDD, scoliosis, history of esophageal stricture with dilatations, and depression presented to the ED with abdominal pain and distention for [redacted] weeks along with very poor p.o. intake and about 20 pound weight loss.  Also reports significant dysphagia with regurgitation of food.  CT of the abdomen and pelvis done on admission showing abdominal ascites with suspicious omental and mesenteric caking of tumor in the left upper quadrant and along transverse colon mesentery. Patient admitted for further work-up.   Subjective:   Returning from paracentesis with about 2.2 L ascites fluid removed.  Feels her abdominal discomfort/distention to be better with that.   Assessment  & Plan :    Principal Problem: Abdominal pain with ascites Peritoneal carcinomatosis Suspect carcinomatous underlying lesion causing malignant ascites.  Underwent 2.2 L ascites fluid removal today.  WBC of >1000 and elevated LDH.  I will place her on empiric IV Rocephin for possible SBP.  Follow fluid culture and cytology. Elevated Ca1 25 and CEA suspicious of either primary malignancy from: Versus ovaries. She has markedly thickened sigmoid colon on CT. Keep n.p.o. Oncology consult based on cytology.   Active  Problems: Dysphagia with loss of weight EGD with biopsy and possible dilatation being planned for this afternoon . Keep n.p.o.  Continue PPI.  COPD with ongoing tobacco use. Stable.  Continue PRN nebs.  Counseled on smoking cessation.  Refused nicotine patch.  Chronic depression Continue citalopram.  Chronic back pain Continue PRN meds.     Code Status : Full code  Family Communication  : Niece at bedside.  Disposition Plan  : Pending work-up  Barriers For Discharge :  Active symptoms  Consults  : GI/radiology  Procedures  : Ultrasound paracentesis, pending EGD, CT abdomen and pelvis, ultrasound abdomen  DVT Prophylaxis  :  Lovenox -   Lab Results  Component Value Date   PLT 611 (H) 01/28/2018    Antibiotics  :   Anti-infectives (From admission, onward)   Start     Dose/Rate Route Frequency Ordered Stop   01/27/18 0130  amoxicillin (AMOXIL) capsule 500 mg  Status:  Discontinued     500 mg Oral 2 times daily 01/27/18 0115 01/27/18 0937        Objective:   Vitals:   01/28/18 0640 01/28/18 0810 01/28/18 1038 01/28/18 1103  BP: (!) 176/92  (!) 114/98 (!) 132/96  Pulse: (!) 102  92 80  Resp: 16  20 18   Temp: 98.3 F (36.8 C)     TempSrc: Oral     SpO2: 90% 91%  91%  Weight:      Height:        Wt Readings from Last 3 Encounters:  01/26/18 36.3 kg (80 lb)     Intake/Output Summary (Last 24 hours) at 01/28/2018 1327 Last data filed at 01/28/2018 0643 Gross per 24 hour  Intake 1421.25 ml  Output 900 ml  Net 521.25 ml     Physical Exam  Gen: Elderly cachectic female not in distress, appears fatigued HEENT: Pallor present, moist mucosa, small cervical lymphadenopathy on the right, Chest: clear b/l, no added sounds CVS: N S1&S2, no murmurs GI: soft, NT, mild abdominal distention, improved after paracentesis, nontender, bowel sounds present Musculoskeletal: warm, no edema     Data Review:    CBC Recent Labs  Lab 01/26/18 1645 01/27/18 0413  01/28/18 0408  WBC 10.5 9.5 12.0*  HGB 11.1* 10.8* 11.5*  HCT 34.2* 34.5* 35.9*  PLT 553* 516* 611*  MCV 87.9 88.5 87.1  MCH 28.5 27.7 27.9  MCHC 32.5 31.3 32.0  RDW 13.6 13.6 13.6  LYMPHSABS  --  0.6*  --   MONOABS  --  0.1  --   EOSABS  --  0.0  --   BASOSABS  --  0.0  --     Chemistries  Recent Labs  Lab 01/26/18 1645 01/27/18 0413 01/28/18 0408  NA 133* 136 136  K 4.3 4.7 3.9  CL 95* 100 102  CO2 30 28 26   GLUCOSE 95 103* 91  BUN 20 15 15   CREATININE 0.68 0.58 0.59  CALCIUM 9.1 8.4* 8.5*  MG 2.3  --   --   AST 19 16 20   ALT 12 10 10   ALKPHOS 82 77 77  BILITOT 0.4 0.3 0.3   ------------------------------------------------------------------------------------------------------------------ No results for input(s): CHOL, HDL, LDLCALC, TRIG, CHOLHDL, LDLDIRECT in the last 72 hours.  No results found for: HGBA1C ------------------------------------------------------------------------------------------------------------------ No results for input(s): TSH, T4TOTAL, T3FREE, THYROIDAB in the last 72 hours.  Invalid input(s): FREET3 ------------------------------------------------------------------------------------------------------------------ No results for input(s): VITAMINB12, FOLATE, FERRITIN, TIBC, IRON, RETICCTPCT in the last 72 hours.  Coagulation profile No results for input(s): INR, PROTIME in the last 168 hours.  No results for input(s): DDIMER in the last 72 hours.  Cardiac Enzymes No results for input(s): CKMB, TROPONINI, MYOGLOBIN in the last 168 hours.  Invalid input(s): CK ------------------------------------------------------------------------------------------------------------------ No results found for: BNP  Inpatient Medications  Scheduled Meds: . citalopram  40 mg Oral Daily  . [START ON 01/29/2018] famotidine  20 mg Oral Q breakfast  . feeding supplement  1 Container Oral TID WC &  HS  . pramipexole  0.125 mg Oral QHS  . tiotropium  18  mcg Inhalation Daily   Continuous Infusions: . sodium chloride 62.5 mL/hr at 01/28/18 0651  . sodium chloride     PRN Meds:.acetaminophen **OR** acetaminophen, albuterol, hydrALAZINE, morphine injection, ondansetron (ZOFRAN) IV  Micro Results Recent Results (from the past 240 hour(s))  Gram stain     Status: None   Collection Time: 01/28/18 10:42 AM  Result Value Ref Range Status   Specimen Description ASCITIC  Final   Special Requests NONE  Final   Gram Stain   Final    NO ORGANISMS SEEN CYTOSPIN SMEAR WBC PRESENT,BOTH PMN AND MONONUCLEAR Performed at [facility], [address], [place], [place] [address]    Report Status 01/28/2018 FINAL  Final    Radiology Reports Ct Abdomen Pelvis W Contrast  Result Date: 01/26/2018 CLINICAL DATA:  20 pound weight loss over the last month. Central abdominal pain and digestion. EXAM: CT ABDOMEN AND PELVIS WITH CONTRAST TECHNIQUE: Multidetector CT imaging of the abdomen and pelvis was performed using the standard protocol following bolus administration of intravenous contrast. CONTRAST:  153mL ISOVUE-300 IOPAMIDOL (ISOVUE-300) INJECTION 61% COMPARISON:  None. FINDINGS: Lower chest: Small to moderate-sized hiatal hernia.  Cachexia. Hepatobiliary: 4 mm hypodense lesion in the lateral segment left hepatic lobe on image 13/2. 6 mm hypodense lesion in the right hepatic lobe on image 18/2. Probable tiny gallstone in the gallbladder, 2 mm in diameter on image 26/2. Borderline gallbladder wall thickening. Mild periportal edema.  No biliary dilatation. Pancreas: Upper normal caliber of the dorsal pancreatic duct. Otherwise unremarkable. Spleen: Unremarkable Adrenals/Urinary Tract: Adrenal glands unremarkable. 7 mm hypodense lesion of the right mid kidney, likely a benign cyst but technically too small to characterize. No appreciable hydronephrosis or urinary tract calculus. Stomach/Bowel: The bowel is suspended in prominent ascites. Prominent wall thickening in  the sigmoid colon, tumor not excluded. Vascular/Lymphatic: Aortoiliac atherosclerotic vascular disease. Reproductive: Ill definition of the uterus, query hysterectomy. Ovaries indistinct. Other: Prominent ascites. Enhancing and some calcified margins of the pelvic portion of the ascites posteriorly for example on images 55-61 of series 2. Suspected tumor deposition along the left upper quadrant omentum. Probable thick tumor throughout the transverse colon and splenic flexure mesentery. Musculoskeletal: Dextroconvex lumbar scoliosis. Lumbar spondylosis and degenerative disc disease including a notable disc protrusion at the L3-4 level. Degenerative arthropathy of both hips. IMPRESSION: 1. Prominent ascites with enhancing margins especially posteriorly in the pelvis, and suspected omental and mesenteric caking of tumor in the left upper quadrant and along the transverse colon mesentery. Appearance highly suspicious for malignancy, common etiologies would be from mucinous colon cancer or ovarian cancer. The ovaries are poorly visualized. Peritonitis is a less likely differential diagnostic consideration given the overall appearance. Correlate with serologic tumor markers. 2. Marked wall thickening in the sigmoid colon could be from diverticulosis or tumor. 3. Cachexia. 4. Other imaging findings of potential clinical significance: Small to moderate-sized hiatal hernia. Possible gallstone. Several tiny hepatic lesions are technically nonspecific. Mild periportal edema. Aortic Atherosclerosis (ICD10-I70.0). Dextroconvex lumbar scoliosis. Lumbar spondylosis and degenerative disc disease. Electronically Signed   By: Van Clines M.D.   On: 01/26/2018 21:38   US Paracentesis  Result Date: 01/28/2018 INDICATION: Peritoneal carcinomatosis by CT EXAM: ULTRASOUND GUIDED DIAGNOSTIC AND THERAPEUTIC PARACENTESIS MEDICATIONS: None. COMPLICATIONS: None immediate. PROCEDURE: Procedure, benefits, and risks of procedure were  discussed with patient. Written informed consent for procedure was obtained. Time out protocol followed. Adequate collection of ascites localized  by ultrasound in RIGHT lower quadrant. Skin prepped and draped in usual sterile fashion. Skin and soft tissues anesthetized with 10 mL of 1% lidocaine. 5 [place] Yueh catheter placed into peritoneal cavity. 2.2 L of yellow ascitic fluid aspirated by vacuum bottle suction. Procedure tolerated well by patient without immediate complication. FINDINGS: A total of approximately 2.2 L of ascitic fluid was removed. Samples were sent to the laboratory as requested by the clinical team. IMPRESSION: Successful ultrasound-guided paracentesis yielding 2.2 liters of peritoneal fluid. Electronically Signed   By: Lavonia Dana M.D.   On: 01/28/2018 11:52   US Abdomen Limited Ruq  Result Date: 01/27/2018 CLINICAL DATA:  Pancreatitis, 20 pounds weight loss EXAM: ULTRASOUND ABDOMEN LIMITED RIGHT UPPER QUADRANT COMPARISON:  CT abdomen and pelvis 01/26/2018 FINDINGS: Gallbladder: Upper normal gallbladder wall thickness. Tiny probable gallbladder polyp 3 mm diameter. No definite shadowing calculi, wall thickening, or sonographic Murphy sign. Common bile duct: Diameter: 4 mm diameter, normal Liver: Normal echogenicity. No definite hepatic mass or nodularity. Portal vein is patent on color Doppler imaging with normal direction of blood flow towards the liver. Significant ascites. Noted RIGHT pleural effusion. IMPRESSION: Significant ascites and small RIGHT pleural effusion. Tiny gallbladder polyp 3 mm diameter without definite gallstones or biliary dilatation. Electronically Signed   By: Lavonia Dana M.D.   On: 01/27/2018 10:28    Time Spent in minutes  25   Karalina Tift M.D on 01/28/2018 at 1:27 PM  Between 7am to 7pm - Pager - [phone]  After 7pm go to [email or web address] - password [facility]  [facility] -  Office  [phone]

## 2018-01-28 NOTE — Progress Notes (Addendum)
Subjective:  Complains of abdominal swelling and discomfort. Last BM on Saturday. New constipation over the past couple of weeks. Feels hungry.   Objective: Vital signs in last 24 hours: Temp:  [98 F (36.7 C)-98.6 F (37 C)] 98.3 F (36.8 C) (07/17 0640) Pulse Rate:  [57-102] 102 (07/17 0640) Resp:  [16-18] 16 (07/17 0640) BP: (146-176)/(67-95) 176/92 (07/17 0640) SpO2:  [90 %-96 %] 91 % (07/17 0810) Last BM Date: 01/24/18 General:   Alert, thin, frail appearing female in NAD. Niece at bedside.  Head:  Normocephalic and atraumatic. Eyes:  Sclera clear, no icterus.  Abdomen:  Soft, distended, moderately tense. Mild tenderness right abd. Normal bowel sounds, without guarding, and without rebound.   Extremities:  Without clubbing, deformity or edema. Neurologic:  Alert and  oriented x4;  grossly normal neurologically. Skin:  Intact without significant lesions or rashes. Psych:  Alert and cooperative. Normal mood and affect.  Intake/Output from previous day: 07/16 0701 - 07/17 0700 In: 1883.8 [P.O.:240; I.V.:1593.8; IV Piggyback:50] Out: 900 [Urine:900] Intake/Output this shift: No intake/output data recorded.  Lab Results: CBC Recent Labs    01/26/18 1645 01/27/18 0413 01/28/18 0408  WBC 10.5 9.5 12.0*  HGB 11.1* 10.8* 11.5*  HCT 34.2* 34.5* 35.9*  MCV 87.9 88.5 87.1  PLT 553* 516* 611*   BMET Recent Labs    01/26/18 1645 01/27/18 0413 01/28/18 0408  NA 133* 136 136  K 4.3 4.7 3.9  CL 95* 100 102  CO2 30 28 26   GLUCOSE 95 103* 91  BUN 20 15 15   CREATININE 0.68 0.58 0.59  CALCIUM 9.1 8.4* 8.5*   LFTs Recent Labs    01/26/18 1645 01/27/18 0413 01/28/18 0408  BILITOT 0.4 0.3 0.3  ALKPHOS 82 77 77  AST 19 16 20   ALT 12 10 10   PROT 6.8 5.8* 5.8*  ALBUMIN 3.0* 2.5* 2.6*   Recent Labs    01/26/18 1645 01/27/18 0413 01/28/18 0408  LIPASE 263* 66* 40   PT/INR No results for input(s): LABPROT, INR in the last 72 hours.    Imaging Studies: Ct  Abdomen Pelvis W Contrast  Result Date: 01/26/2018 CLINICAL DATA:  20 pound weight loss over the last month. Central abdominal pain and digestion. EXAM: CT ABDOMEN AND PELVIS WITH CONTRAST TECHNIQUE: Multidetector CT imaging of the abdomen and pelvis was performed using the standard protocol following bolus administration of intravenous contrast. CONTRAST:  174mL ISOVUE-300 IOPAMIDOL (ISOVUE-300) INJECTION 61% COMPARISON:  None. FINDINGS: Lower chest: Small to moderate-sized hiatal hernia.  Cachexia. Hepatobiliary: 4 mm hypodense lesion in the lateral segment left hepatic lobe on image 13/2. 6 mm hypodense lesion in the right hepatic lobe on image 18/2. Probable tiny gallstone in the gallbladder, 2 mm in diameter on image 26/2. Borderline gallbladder wall thickening. Mild periportal edema.  No biliary dilatation. Pancreas: Upper normal caliber of the dorsal pancreatic duct. Otherwise unremarkable. Spleen: Unremarkable Adrenals/Urinary Tract: Adrenal glands unremarkable. 7 mm hypodense lesion of the right mid kidney, likely a benign cyst but technically too small to characterize. No appreciable hydronephrosis or urinary tract calculus. Stomach/Bowel: The bowel is suspended in prominent ascites. Prominent wall thickening in the sigmoid colon, tumor not excluded. Vascular/Lymphatic: Aortoiliac atherosclerotic vascular disease. Reproductive: Ill definition of the uterus, query hysterectomy. Ovaries indistinct. Other: Prominent ascites. Enhancing and some calcified margins of the pelvic portion of the ascites posteriorly for example on images 55-61 of series 2. Suspected tumor deposition along the left upper quadrant omentum. Probable thick tumor throughout the  transverse colon and splenic flexure mesentery. Musculoskeletal: Dextroconvex lumbar scoliosis. Lumbar spondylosis and degenerative disc disease including a notable disc protrusion at the L3-4 level. Degenerative arthropathy of both hips. IMPRESSION: 1.  Prominent ascites with enhancing margins especially posteriorly in the pelvis, and suspected omental and mesenteric caking of tumor in the left upper quadrant and along the transverse colon mesentery. Appearance highly suspicious for malignancy, common etiologies would be from mucinous colon cancer or ovarian cancer. The ovaries are poorly visualized. Peritonitis is a less likely differential diagnostic consideration given the overall appearance. Correlate with serologic tumor markers. 2. Marked wall thickening in the sigmoid colon could be from diverticulosis or tumor. 3. Cachexia. 4. Other imaging findings of potential clinical significance: Small to moderate-sized hiatal hernia. Possible gallstone. Several tiny hepatic lesions are technically nonspecific. Mild periportal edema. Aortic Atherosclerosis (ICD10-I70.0). Dextroconvex lumbar scoliosis. Lumbar spondylosis and degenerative disc disease. Electronically Signed   By: Van Clines M.D.   On: 01/26/2018 21:38   US Abdomen Limited Ruq  Result Date: 01/27/2018 CLINICAL DATA:  Pancreatitis, 20 pounds weight loss EXAM: ULTRASOUND ABDOMEN LIMITED RIGHT UPPER QUADRANT COMPARISON:  CT abdomen and pelvis 01/26/2018 FINDINGS: Gallbladder: Upper normal gallbladder wall thickness. Tiny probable gallbladder polyp 3 mm diameter. No definite shadowing calculi, wall thickening, or sonographic Murphy sign. Common bile duct: Diameter: 4 mm diameter, normal Liver: Normal echogenicity. No definite hepatic mass or nodularity. Portal vein is patent on color Doppler imaging with normal direction of blood flow towards the liver. Significant ascites. Noted RIGHT pleural effusion. IMPRESSION: Significant ascites and small RIGHT pleural effusion. Tiny gallbladder polyp 3 mm diameter without definite gallstones or biliary dilatation. Electronically Signed   By: Lavonia Dana M.D.   On: 01/27/2018 10:28  [2 weeks]   Assessment: 82 year old female presenting with abdominal  pain for three weeks, weight loss progressive over past several years, early satiety, and vague solid food dysphagia. CT with prominent ascites and suspected omental and mesenteric caking of tumor in left upper quadrant and along transverse colon mesentery, with concern for colon or ovarian origin. Marked wall thickening noted in sigmoid colon, tumor not excluded. Other findings as above. Although lipase elevated on admission, no CT findings of pancreatitis. She has had EGD/dilation in the remote past in [place] (?2005), and her last colonoscopy was around that time as well with reported polyps.  CA125 elevated at 91. CEA slightly elevated at 6.2.   Oncology has not been consulted until further evaluation per GI as per hospitalist notes.     Plan: 1. For paracentesis today. Discussed with Sol Blazing, RN in radiology. First priority for fluid is cytology followed by cell count and other labs. Discussed desire for AM abd paracentesis due to pending EGD this afternoon. 2. Last heparin Olmito and Olmito at 2100.  Laureen Ochs. 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 Pylori.  Laureen Ochs. Bernarda Caffey [facility] [phone] 7/17/20194:11 PM

## 2018-01-28 NOTE — Progress Notes (Signed)
Paracentesis complete no signs of distress.  

## 2018-01-28 NOTE — Progress Notes (Signed)
Returned from EGD procedure around 1600.  States throat is sore at this time but declined pain medication. Swallowed protonix in applesauce with no difficulty.

## 2018-01-29 DIAGNOSIS — R1319 Other dysphagia: Secondary | ICD-10-CM

## 2018-01-29 DIAGNOSIS — R188 Other ascites: Secondary | ICD-10-CM

## 2018-01-29 DIAGNOSIS — R1310 Dysphagia, unspecified: Secondary | ICD-10-CM

## 2018-01-29 LAB — BASIC METABOLIC PANEL
ANION GAP: 5 (ref 5–15)
BUN: 13 mg/dL (ref 8–23)
CO2: 25 mmol/L (ref 22–32)
Calcium: 8 mg/dL — ABNORMAL LOW (ref 8.9–10.3)
Chloride: 105 mmol/L (ref 98–111)
Creatinine, Ser: 0.58 mg/dL (ref 0.44–1.00)
GFR calc Af Amer: >60 mL/min (ref >60)
GLUCOSE: 84 mg/dL (ref 70–99)
POTASSIUM: 4 mmol/L (ref 3.5–5.1)
Sodium: 135 mmol/L (ref 135–145)

## 2018-01-29 MED ORDER — NICOTINE 21 MG/24HR TD PT24
21.0000 mg | MEDICATED_PATCH | Freq: Every day | TRANSDERMAL | Status: DC
  Administered 2018-01-29 – 2018-01-30 (×2): 21 mg via TRANSDERMAL
  Filled 2018-01-29 (×5): qty 1

## 2018-01-29 MED ORDER — POLYETHYLENE GLYCOL 3350 17 G PO PACK
17.0000 g | PACK | Freq: Every day | ORAL | Status: DC
  Administered 2018-01-29 – 2018-02-02 (×5): 17 g via ORAL
  Filled 2018-01-29 (×5): qty 1

## 2018-01-29 NOTE — Progress Notes (Signed)
PROGRESS NOTE                                                                                                                                                                                                             Patient Demographics:    Christy Valdez, is a 82 y.o. female, DOB - [birth year], JJK:[id number]  Admit date - 01/26/2018   Admitting Physician Reubin Milan, MD  Outpatient Primary MD for the patient is Quentin Cornwall, MD  LOS - 2  Outpatient Specialists: GI  Chief Complaint  Patient presents with  . Abdominal Pain       Brief Narrative 82 year old female with history of chronic back pain, COPD with ongoing tobacco use, lumbar DDD, scoliosis, history of esophageal stricture with dilatations, and depression presented to the ED with abdominal pain and distention for [redacted] weeks along with very poor p.o. intake and about 20 pound weight loss.  Also reports significant dysphagia with regurgitation of food.  CT of the abdomen and pelvis done on admission showing abdominal ascites with suspicious omental and mesenteric caking of tumor in the left upper quadrant and along transverse colon mesentery. Patient admitted for further work-up.   Subjective:   Complains of some abdominal discomfort.  Has been able to tolerate diet after EGD yesterday.  Reports being constipated.   Assessment  & Plan :    Principal Problem: Abdominal pain with ascites Peritoneal carcinomatosis Suspect carcinomatous underlying lesion causing malignant ascites.  Underwent 2.2 L ascites fluid removal today.  WBC of >1000 and elevated LDH.  Placed her on empiric IV Rocephin for possible SBP.  Fluid culture negative for growth.  Cytology pending. Elevated Ca1 25 and CEA suspicious of either primary malignancy from: Versus ovaries. She has markedly thickened sigmoid colon on CT.  Oncology consult based on cytology.   Active  Problems: Dysphagia with loss of weight EGD done showing moderate Schatzki's ring that was dilated, small hiatal hernia.  No mass noted.  Tolerating dysphagia level 2 diet.  Continue PPI.  COPD with ongoing tobacco use. Stable.  Continue PRN nebs.  Counseled on smoking cessation.  Refused nicotine patch.  Chronic depression Continue citalopram.  Chronic back pain Continue PRN meds.     Code Status : Full code  Family Communication  : None at bedside today.  Disposition Plan  :  Pending peritoneal fluid cytology and oncology consult (possibly discharge tomorrow)  Barriers For Discharge : Active symptoms  Consults  : GI/radiology  Procedures  : Ultrasound paracentesis, pending EGD, CT abdomen and pelvis, ultrasound abdomen  DVT Prophylaxis  :  Lovenox -   Lab Results  Component Value Date   PLT 611 (H) 01/28/2018    Antibiotics  :   Anti-infectives (From admission, onward)   Start     Dose/Rate Route Frequency Ordered Stop   01/28/18 1430  cefTRIAXone (ROCEPHIN) 2 g in sodium chloride 0.9 % 100 mL IVPB     2 g 200 mL/hr over 30 Minutes Intravenous Every 24 hours 01/28/18 1337     01/27/18 0130  amoxicillin (AMOXIL) capsule 500 mg  Status:  Discontinued     500 mg Oral 2 times daily 01/27/18 0115 01/27/18 0937        Objective:   Vitals:   01/28/18 2020 01/28/18 2155 01/29/18 0726 01/29/18 0807  BP:  (!) 152/66 132/74   Pulse:  69 74   Resp:  18 18   Temp:  98.6 F (37 C) 98.4 F (36.9 C)   TempSrc:  Oral Oral   SpO2: (!) 89% 98% 97% 92%  Weight:      Height:        Wt Readings from Last 3 Encounters:  01/28/18 33.6 kg (74 lb)     Intake/Output Summary (Last 24 hours) at 01/29/2018 1301 Last data filed at 01/28/2018 1700 Gross per 24 hour  Intake 120 ml  Output -  Net 120 ml   Physical exam General: Elderly cachectic female not in distress, fatigue HEENT: Pallor present, moist mucosa, supple neck Chest: Clear bilaterally CVS: Normal S1-S2, no  murmurs GI: Soft, mild abdominal distention (increased from yesterday), mild tenderness to pressure, bowel sounds present Muscular skeletal: Warm, no edema       Data Review:    CBC Recent Labs  Lab 01/26/18 1645 01/27/18 0413 01/28/18 0408  WBC 10.5 9.5 12.0*  HGB 11.1* 10.8* 11.5*  HCT 34.2* 34.5* 35.9*  PLT 553* 516* 611*  MCV 87.9 88.5 87.1  MCH 28.5 27.7 27.9  MCHC 32.5 31.3 32.0  RDW 13.6 13.6 13.6  LYMPHSABS  --  0.6*  --   MONOABS  --  0.1  --   EOSABS  --  0.0  --   BASOSABS  --  0.0  --     Chemistries  Recent Labs  Lab 01/26/18 1645 01/27/18 0413 01/28/18 0408 01/29/18 0419  NA 133* 136 136 135  K 4.3 4.7 3.9 4.0  CL 95* 100 102 105  CO2 30 28 26 25   GLUCOSE 95 103* 91 84  BUN 20 15 15 13   CREATININE 0.68 0.58 0.59 0.58  CALCIUM 9.1 8.4* 8.5* 8.0*  MG 2.3  --   --   --   AST 19 16 20   --   ALT 12 10 10   --   ALKPHOS 82 77 77  --   BILITOT 0.4 0.3 0.3  --    ------------------------------------------------------------------------------------------------------------------ No results for input(s): CHOL, HDL, LDLCALC, TRIG, CHOLHDL, LDLDIRECT in the last 72 hours.  No results found for: HGBA1C ------------------------------------------------------------------------------------------------------------------ No results for input(s): TSH, T4TOTAL, T3FREE, THYROIDAB in the last 72 hours.  Invalid input(s): FREET3 ------------------------------------------------------------------------------------------------------------------ No results for input(s): VITAMINB12, FOLATE, FERRITIN, TIBC, IRON, RETICCTPCT in the last 72 hours.  Coagulation profile No results for input(s): INR, PROTIME in the last 168 hours.  No  results for input(s): DDIMER in the last 72 hours.  Cardiac Enzymes No results for input(s): CKMB, TROPONINI, MYOGLOBIN in the last 168 hours.  Invalid input(s):  CK ------------------------------------------------------------------------------------------------------------------ No results found for: BNP  Inpatient Medications  Scheduled Meds: . citalopram  40 mg Oral Daily  . famotidine  20 mg Oral Q breakfast  . feeding supplement  1 Container Oral TID WC & HS  . pantoprazole  40 mg Oral Daily  . polyethylene glycol  17 g Oral Daily  . pramipexole  0.125 mg Oral QHS  . tiotropium  18 mcg Inhalation Daily   Continuous Infusions: . sodium chloride 62.5 mL/hr at 01/29/18 0425  . cefTRIAXone (ROCEPHIN)  IV Stopped (01/28/18 1710)   PRN Meds:.acetaminophen **OR** acetaminophen, albuterol, hydrALAZINE, morphine injection, ondansetron (ZOFRAN) IV  Micro Results Recent Results (from the past 240 hour(s))  Culture, body fluid-bottle     Status: None (Preliminary result)   Collection Time: 01/28/18 10:42 AM  Result Value Ref Range Status   Specimen Description ASCITIC  Final   Special Requests BOTTLES DRAWN AEROBIC AND ANAEROBIC 10CC  Final   Culture   Final    NO GROWTH < 24 HOURS Performed at [facility], [address] [place], [place] [address]    Report Status PENDING  Incomplete  Gram stain     Status: None   Collection Time: 01/28/18 10:42 AM  Result Value Ref Range Status   Specimen Description ASCITIC  Final   Special Requests NONE  Final   Gram Stain   Final    NO ORGANISMS SEEN CYTOSPIN SMEAR WBC PRESENT,BOTH PMN AND MONONUCLEAR Performed at [facility], [address], [place], [place] [address]    Report Status 01/28/2018 FINAL  Final    Radiology Reports Ct Abdomen Pelvis W Contrast  Result Date: 01/26/2018 CLINICAL DATA:  20 pound weight loss over the last month. Central abdominal pain and digestion. EXAM: CT ABDOMEN AND PELVIS WITH CONTRAST TECHNIQUE: Multidetector CT imaging of the abdomen and pelvis was performed using the standard protocol following bolus administration of intravenous contrast. CONTRAST:  1103mL  ISOVUE-300 IOPAMIDOL (ISOVUE-300) INJECTION 61% COMPARISON:  None. FINDINGS: Lower chest: Small to moderate-sized hiatal hernia.  Cachexia. Hepatobiliary: 4 mm hypodense lesion in the lateral segment left hepatic lobe on image 13/2. 6 mm hypodense lesion in the right hepatic lobe on image 18/2. Probable tiny gallstone in the gallbladder, 2 mm in diameter on image 26/2. Borderline gallbladder wall thickening. Mild periportal edema.  No biliary dilatation. Pancreas: Upper normal caliber of the dorsal pancreatic duct. Otherwise unremarkable. Spleen: Unremarkable Adrenals/Urinary Tract: Adrenal glands unremarkable. 7 mm hypodense lesion of the right mid kidney, likely a benign cyst but technically too small to characterize. No appreciable hydronephrosis or urinary tract calculus. Stomach/Bowel: The bowel is suspended in prominent ascites. Prominent wall thickening in the sigmoid colon, tumor not excluded. Vascular/Lymphatic: Aortoiliac atherosclerotic vascular disease. Reproductive: Ill definition of the uterus, query hysterectomy. Ovaries indistinct. Other: Prominent ascites. Enhancing and some calcified margins of the pelvic portion of the ascites posteriorly for example on images 55-61 of series 2. Suspected tumor deposition along the left upper quadrant omentum. Probable thick tumor throughout the transverse colon and splenic flexure mesentery. Musculoskeletal: Dextroconvex lumbar scoliosis. Lumbar spondylosis and degenerative disc disease including a notable disc protrusion at the L3-4 level. Degenerative arthropathy of both hips. IMPRESSION: 1. Prominent ascites with enhancing margins especially posteriorly in the pelvis, and suspected omental and mesenteric caking of tumor in the left upper quadrant  and along the transverse colon mesentery. Appearance highly suspicious for malignancy, common etiologies would be from mucinous colon cancer or ovarian cancer. The ovaries are poorly visualized. Peritonitis is a less  likely differential diagnostic consideration given the overall appearance. Correlate with serologic tumor markers. 2. Marked wall thickening in the sigmoid colon could be from diverticulosis or tumor. 3. Cachexia. 4. Other imaging findings of potential clinical significance: Small to moderate-sized hiatal hernia. Possible gallstone. Several tiny hepatic lesions are technically nonspecific. Mild periportal edema. Aortic Atherosclerosis (ICD10-I70.0). Dextroconvex lumbar scoliosis. Lumbar spondylosis and degenerative disc disease. Electronically Signed   By: Van Clines M.D.   On: 01/26/2018 21:38   US Paracentesis  Result Date: 01/28/2018 INDICATION: Peritoneal carcinomatosis by CT EXAM: ULTRASOUND GUIDED DIAGNOSTIC AND THERAPEUTIC PARACENTESIS MEDICATIONS: None. COMPLICATIONS: None immediate. PROCEDURE: Procedure, benefits, and risks of procedure were discussed with patient. Written informed consent for procedure was obtained. Time out protocol followed. Adequate collection of ascites localized by ultrasound in RIGHT lower quadrant. Skin prepped and draped in usual sterile fashion. Skin and soft tissues anesthetized with 10 mL of 1% lidocaine. 5 [place] Yueh catheter placed into peritoneal cavity. 2.2 L of yellow ascitic fluid aspirated by vacuum bottle suction. Procedure tolerated well by patient without immediate complication. FINDINGS: A total of approximately 2.2 L of ascitic fluid was removed. Samples were sent to the laboratory as requested by the clinical team. IMPRESSION: Successful ultrasound-guided paracentesis yielding 2.2 liters of peritoneal fluid. Electronically Signed   By: Lavonia Dana M.D.   On: 01/28/2018 11:52   US Abdomen Limited Ruq  Result Date: 01/27/2018 CLINICAL DATA:  Pancreatitis, 20 pounds weight loss EXAM: ULTRASOUND ABDOMEN LIMITED RIGHT UPPER QUADRANT COMPARISON:  CT abdomen and pelvis 01/26/2018 FINDINGS: Gallbladder: Upper normal gallbladder wall thickness. Tiny probable  gallbladder polyp 3 mm diameter. No definite shadowing calculi, wall thickening, or sonographic Murphy sign. Common bile duct: Diameter: 4 mm diameter, normal Liver: Normal echogenicity. No definite hepatic mass or nodularity. Portal vein is patent on color Doppler imaging with normal direction of blood flow towards the liver. Significant ascites. Noted RIGHT pleural effusion. IMPRESSION: Significant ascites and small RIGHT pleural effusion. Tiny gallbladder polyp 3 mm diameter without definite gallstones or biliary dilatation. Electronically Signed   By: Lavonia Dana M.D.   On: 01/27/2018 10:28    Time Spent in minutes  25   Hykeem Ojeda M.D on 01/29/2018 at 1:01 PM  Between 7am to 7pm - Pager - [phone]  After 7pm go to [email or web address] - password [facility]  [facility] -  Office  [phone]

## 2018-01-29 NOTE — Progress Notes (Signed)
Patient is a current smoker and smokes 1.5 packs cigarette per day. Patient requesting a nicotine patch. MD has been notified. Received verbal order for nicotine (Nicoderm CQ) patch 21 mg. Transdermal Daily.

## 2018-01-29 NOTE — Progress Notes (Addendum)
Subjective: Feels ok overall. Does not feel like her abdomen has swollen/distended. Dysphagia is better, some sore throat this morning but improving. Mild intermittent abdominal discomfort. No N/V. No melena/hematochezia. No other GI complaints.  Objective: Vital signs in last 24 hours: Temp:  [98.4 F (36.9 C)-99.1 F (37.3 C)] 98.4 F (36.9 C) (07/18 0726) Pulse Rate:  [69-92] 74 (07/18 0726) Resp:  [14-22] 18 (07/18 0726) BP: (114-162)/(66-98) 132/74 (07/18 0726) SpO2:  [88 %-98 %] 92 % (07/18 0807) Weight:  [74 lb (33.6 kg)] 74 lb (33.6 kg) (07/17 1435) Last BM Date: 01/24/18 General:   Alert and oriented, pleasant. Appears very frail and emaciated. Head:  Normocephalic and atraumatic. Eyes:  No icterus, sclera clear. Conjuctiva pink.  Heart:  S1, S2 present, no murmurs noted.  Lungs: Clear to auscultation bilaterally, without wheezing, rales, or rhonchi.  Abdomen:  Bowel sounds present, soft, non-tender with light palpation, non-distended. No HSM or hernias noted. No rebound or guarding. No masses appreciated  Msk:  Symmetrical without gross deformities. Pulses:  Normal bilateral DP  pulses noted. Extremities:  Without clubbing or edema. Neurologic:  Alert and  oriented x4;  grossly normal neurologically. Skin:  Warm and dry, intact without significant lesions.  Psych:  Alert and cooperative. Normal mood and affect.  Intake/Output from previous day: 07/17 0701 - 07/18 0700 In: 120 [P.O.:120] Out: -  Intake/Output this shift: No intake/output data recorded.  Lab Results: Recent Labs    01/26/18 1645 01/27/18 0413 01/28/18 0408  WBC 10.5 9.5 12.0*  HGB 11.1* 10.8* 11.5*  HCT 34.2* 34.5* 35.9*  PLT 553* 516* 611*   BMET Recent Labs    01/27/18 0413 01/28/18 0408 01/29/18 0419  NA 136 136 135  K 4.7 3.9 4.0  CL 100 102 105  CO2 28 26 25   GLUCOSE 103* 91 84  BUN 15 15 13   CREATININE 0.58 0.59 0.58  CALCIUM 8.4* 8.5* 8.0*   LFT Recent Labs   01/26/18 1645 01/27/18 0413 01/28/18 0408  PROT 6.8 5.8* 5.8*  ALBUMIN 3.0* 2.5* 2.6*  AST 19 16 20   ALT 12 10 10   ALKPHOS 82 77 77  BILITOT 0.4 0.3 0.3   PT/INR No results for input(s): LABPROT, INR in the last 72 hours. Hepatitis Panel No results for input(s): HEPBSAG, HCVAB, HEPAIGM, HEPBIGM in the last 72 hours.   Studies/Results: US Paracentesis  Result Date: 01/28/2018 INDICATION: Peritoneal carcinomatosis by CT EXAM: ULTRASOUND GUIDED DIAGNOSTIC AND THERAPEUTIC PARACENTESIS MEDICATIONS: None. COMPLICATIONS: None immediate. PROCEDURE: Procedure, benefits, and risks of procedure were discussed with patient. Written informed consent for procedure was obtained. Time out protocol followed. Adequate collection of ascites localized by ultrasound in RIGHT lower quadrant. Skin prepped and draped in usual sterile fashion. Skin and soft tissues anesthetized with 10 mL of 1% lidocaine. 5 [place] Yueh catheter placed into peritoneal cavity. 2.2 L of yellow ascitic fluid aspirated by vacuum bottle suction. Procedure tolerated well by patient without immediate complication. FINDINGS: A total of approximately 2.2 L of ascitic fluid was removed. Samples were sent to the laboratory as requested by the clinical team. IMPRESSION: Successful ultrasound-guided paracentesis yielding 2.2 liters of peritoneal fluid. Electronically Signed   By: Lavonia Dana M.D.   On: 01/28/2018 11:52   US Abdomen Limited Ruq  Result Date: 01/27/2018 CLINICAL DATA:  Pancreatitis, 20 pounds weight loss EXAM: ULTRASOUND ABDOMEN LIMITED RIGHT UPPER QUADRANT COMPARISON:  CT abdomen and pelvis 01/26/2018 FINDINGS: Gallbladder: Upper normal gallbladder wall thickness. Tiny probable gallbladder  polyp 3 mm diameter. No definite shadowing calculi, wall thickening, or sonographic Murphy sign. Common bile duct: Diameter: 4 mm diameter, normal Liver: Normal echogenicity. No definite hepatic mass or nodularity. Portal vein is patent on color  Doppler imaging with normal direction of blood flow towards the liver. Significant ascites. Noted RIGHT pleural effusion. IMPRESSION: Significant ascites and small RIGHT pleural effusion. Tiny gallbladder polyp 3 mm diameter without definite gallstones or biliary dilatation. Electronically Signed   By: Lavonia Dana M.D.   On: 01/27/2018 10:28    Assessment: 82 year old female presenting with abdominal pain for three weeks, weight loss progressive over past several years, early satiety, and vague solid food dysphagia. CT with prominent ascites and suspected omental and mesenteric caking of tumor in left upper quadrant and along transverse colon mesentery, with concern for colon or ovarian origin. Marked wall thickening noted in sigmoid colon, tumor not excluded. Other findings as above. Although lipase elevated on admission, no CT findings of pancreatitis. She has had EGD/dilation in the remote past in [place] (2005?), and her last colonoscopy was around that time as well with reported polyps.  CA125 elevated at 91. CEA slightly elevated at 6.2.   Oncology has not been consulted until further evaluation per GI as per hospitalist notes.   Underwent paracentesis of 2.2L yesterday. Gram stain no oganisms but noted WBCs, culture no growth in 24 hours, cell counts not indicative of SBP. Did have elevated fluid WBC, cytology pending.  EGD completed yesterday for dysphasia found moderate Schatzki's ring at the GE junction status post dilation, small hiatal hernia, otherwise exam without abnormality.  Recommended dysphagia 2 diet, Protonix 40 mg daily, no repeat upper endoscopy, outpatient GI follow-up.  Today she is clinically improved from a GI standpoint. Dysphagia improved. Recommended for her to ask for throat lozenges or chloraseptic spray if needed for sore throat. She appears very frail and emaciated/thin. BMP today stable/near normal.   Plan: 1. Lozenges/chloraseptic spray as needed (sore throat  should resolve shortly) 2. Continue soft diet 3. Will benefit from oncology evaluation (IP vs OP) 4. Supportive measures 5. We will follow peripherally   Thank you for allowing [place] to participate in the care of Dickinson, DNP, AGNP-C Adult & Gerontological Nurse Practitioner [facility]     LOS: 2 days    01/29/2018, 9:33 AM

## 2018-01-30 ENCOUNTER — Encounter (HOSPITAL_COMMUNITY): Payer: Self-pay | Admitting: Internal Medicine

## 2018-01-30 MED ORDER — SENNOSIDES-DOCUSATE SODIUM 8.6-50 MG PO TABS
2.0000 | ORAL_TABLET | Freq: Two times a day (BID) | ORAL | Status: DC
  Administered 2018-01-30 – 2018-02-02 (×6): 2 via ORAL
  Filled 2018-01-30 (×6): qty 2

## 2018-01-30 MED ORDER — OXYCODONE-ACETAMINOPHEN 5-325 MG PO TABS
2.0000 | ORAL_TABLET | ORAL | Status: DC | PRN
  Administered 2018-01-31: 1 via ORAL
  Filled 2018-01-30 (×2): qty 2

## 2018-01-30 NOTE — Care Management Important Message (Signed)
Important Message  Patient Details  Name: Christy Valdez MRN: [id number] Date of Birth: September 03, 1935   Medicare Important Message Given:  Yes    Sherald Barge, RN 01/30/2018, 1:41 PM

## 2018-01-30 NOTE — Evaluation (Signed)
Physical Therapy Evaluation Patient Details Name: Christy Valdez MRN: [id number] DOB: 07/18/1935 Today's Date: 01/30/2018   History of Present Illness  Christy Valdez is a 82 y.o. female with medical history significant of chronic back pain, COPD, scoliosis, lumbar DDD, depression who is coming to the emergency department with complaints of abdominal pain for the past 3 weeks.  She states that she had one episode of vomiting last week.  She states that she lost 20 pounds in the past 2 years and her poor appetite also started 2 years ago, however the patient's daughter stated earlier to Dr. Melina Copa that she has had very poor appetite and lost 20 pounds in the past month.  She has been having trouble with constipation and her last bowel movement was yesterday.  She has to use over-the-counter laxatives to induce BM.  She denies diarrhea, melena or hematochezia.  No dysuria, frequency or hematuria.  She also has had cough, wheezing and dyspnea recently.  Denies fever, chills, sore throat, hemoptysis, chest pain, palpitations, dizziness, diaphoresis, PND, orthopnea or pitting edema lower extremities.  No heat or cold intolerance.  No polyuria, polydipsia, polyphagia or blurred vision.  Denies skin pruritus or rashes.    Clinical Impression  Patient has difficulty for sitting up due to c/o abdominal pain, able to ambulate in hallway without loss of balance, but limited due to fatigue and tolerated sitting up in chair after therapy.  Patient will benefit from continued physical therapy in hospital and recommended venue below to increase strength, balance, endurance for safe ADLs and gait.    Follow Up Recommendations SNF;Supervision/Assistance - 24 hour    Equipment Recommendations  None recommended by PT    Recommendations for Other Services       Precautions / Restrictions Precautions Precautions: Fall Restrictions Weight Bearing Restrictions: No      Mobility  Bed Mobility Overal bed mobility:  Needs Assistance Bed Mobility: Rolling;Sidelying to Sit Rolling: Supervision Sidelying to sit: Min assist       General bed mobility comments: slow labored movement, c/o increased abdominal pain  Transfers Overall transfer level: Needs assistance Equipment used: Rolling walker (2 wheeled) Transfers: Sit to/from [facility] Sit to Stand: Min assist Stand pivot transfers: Min assist       General transfer comment: labored movement  Ambulation/Gait Ambulation/Gait assistance: Min assist Gait Distance (Feet): 50 Feet Assistive device: Rolling walker (2 wheeled) Gait Pattern/deviations: Decreased step length - right;Decreased step length - left;Decreased stride length Gait velocity: decreased   General Gait Details: demonstrates slow labored unsteady cadence without loss of balance, limited secondary to c/o fatigue  Stairs            Wheelchair Mobility    Modified Rankin (Stroke Patients Only)       Balance Overall balance assessment: Needs assistance Sitting-balance support: Feet supported;No upper extremity supported Sitting balance-Leahy Scale: Good     Standing balance support: No upper extremity supported;During functional activity Standing balance-Leahy Scale: Poor Standing balance comment: fair/poor without AD, fair with RW                             Pertinent Vitals/Pain Pain Assessment: 0-10 Pain Score: 8  Pain Location: abdomen Pain Descriptors / Indicators: Aching Pain Intervention(s): Limited activity within patient's tolerance;Monitored during session;Patient requesting pain meds-RN notified    Home Living Family/patient expects to be discharged to:: Private residence Living Arrangements: Spouse/significant other(spouse unable to assist) Available Help at  Discharge: Family Type of Home: House Home Access: Level entry     Home Layout: One level Home Equipment: [place] - 2 wheels;Shower seat;Cane - single  point;Wheelchair - manual      Prior Function Level of Independence: Independent with assistive device(s)         Comments: household ambulator with RW PRN     Hand Dominance        Extremity/Trunk Assessment   Upper Extremity Assessment Upper Extremity Assessment: Generalized weakness    Lower Extremity Assessment Lower Extremity Assessment: Generalized weakness    Cervical / Trunk Assessment Cervical / Trunk Assessment: Normal  Communication   Communication: No difficulties  Cognition Arousal/Alertness: Awake/alert Behavior During Therapy: WFL for tasks assessed/performed Overall Cognitive Status: Within Functional Limits for tasks assessed                                        General Comments      Exercises     Assessment/Plan    PT Assessment Patient needs continued PT services  PT Problem List Decreased strength;Decreased activity tolerance;Decreased balance;Decreased mobility       PT Treatment Interventions Gait training;Stair training;Functional mobility training;Therapeutic activities;Therapeutic exercise;Patient/family education    PT Goals (Current goals can be found in the Care Plan section)  Acute Rehab PT Goals Patient Stated Goal: return home after rehab PT Goal Formulation: With patient Time For Goal Achievement: 02/16/18 Potential to Achieve Goals: Good    Frequency Min 3X/week   Barriers to discharge        Co-evaluation               AM-PAC PT "6 Clicks" Daily Activity  Outcome Measure Difficulty turning over in bed (including adjusting bedclothes, sheets and blankets)?: A Little Difficulty moving from lying on back to sitting on the side of the bed? : A Lot Difficulty sitting down on and standing up from a chair with arms (e.g., wheelchair, bedside commode, etc,.)?: A Little Help needed moving to and from a bed to chair (including a wheelchair)?: A Little Help needed walking in hospital room?: A  Little Help needed climbing 3-5 steps with a railing? : A Lot 6 Click Score: 16    End of Session   Activity Tolerance: Patient tolerated treatment well;Patient limited by fatigue Patient left: in chair;with call bell/phone within reach Nurse Communication: Mobility status;Other (comment)(RN aware that patient left up in chair) PT Visit Diagnosis: Unsteadiness on feet (R26.81);Other abnormalities of gait and mobility (R26.89);Muscle weakness (generalized) (M62.81)    Time: 1448-1856 PT Time Calculation (min) (ACUTE ONLY): 28 min   Charges:   PT Evaluation $PT Eval Moderate Complexity: 1 Mod PT Treatments $Therapeutic Activity: 23-37 mins   PT G Codes:        11:52 AM, 2018-02-10 Lonell Grandchild, MPT Physical Therapist with [facility] [phone] office [phone] mobile phone

## 2018-01-30 NOTE — [place] FL2 (Signed)
[place] MEDICAID FL2 LEVEL OF CARE SCREENING TOOL     IDENTIFICATION  Patient Name: Christy Valdez Birthdate: March 14, 1936 Sex: female Admission Date (Current Location): 01/26/2018  [facility] and [place] Number:  [employer] and Address:  [place] [address] [place]      Provider Number: [id number]  Attending Physician Name and Address:  Louellen Molder, MD  Relative Name and Phone Number:  Truddie Coco (daughter) [phone]    Current Level of Care: Hospital Recommended Level of Care: [place] Prior Approval Number:    Date Approved/Denied:   PASRR Number:    Discharge Plan: SNF    Current Diagnoses: Patient Active Problem List   Diagnosis Date Noted  . Other ascites   . Esophageal dysphagia   . Peritoneal carcinomatosis ([place])   . Loss of weight   . Intractable abdominal pain 01/26/2018  . Chronic back pain 01/26/2018  . COPD (chronic obstructive pulmonary disease) ([place]) 01/26/2018  . Depression 01/26/2018  . Tobacco use 01/26/2018  . Hyponatremia 01/26/2018  . COPD exacerbation ([place]) 01/26/2018    Orientation RESPIRATION BLADDER Height & Weight     Self, Time, Situation, Place  Normal Continent Weight: 83 lb 14.4 oz (38.1 kg) Height:  5\' 2"  (157.5 cm)  BEHAVIORAL SYMPTOMS/MOOD NEUROLOGICAL BOWEL NUTRITION STATUS      Continent Diet(see dc summary)  AMBULATORY STATUS COMMUNICATION OF NEEDS Skin   Limited Assist Verbally Normal                       Personal Care Assistance Level of Assistance  Bathing, Feeding, Dressing Bathing Assistance: Limited assistance Feeding assistance: Independent Dressing Assistance: Limited assistance     Functional Limitations Info  Sight, Hearing, Speech Sight Info: Adequate Hearing Info: Adequate Speech Info: Adequate    SPECIAL CARE FACTORS FREQUENCY  PT (By licensed PT)     PT Frequency: 5x/week              Contractures Contractures Info:  Not present    Additional Factors Info  Code Status, Allergies Code Status Info: Full Allergies Info: Codeine           Current Medications (01/30/2018):  This is the current hospital active medication list Current Facility-Administered Medications  Medication Dose Route Frequency Provider Last Rate Last Dose  . 0.9 %  sodium chloride infusion   Intravenous Continuous Daneil Dolin, MD 62.5 mL/hr at 01/30/18 1133    . acetaminophen (TYLENOL) tablet 650 mg  650 mg Oral Q6H PRN Daneil Dolin, MD       Or  . acetaminophen (TYLENOL) suppository 650 mg  650 mg Rectal Q6H PRN Daneil Dolin, MD      . albuterol (PROVENTIL) (2.5 MG/3ML) 0.083% nebulizer solution 2.5 mg  2.5 mg Nebulization Q6H PRN Daneil Dolin, MD      . cefTRIAXone (ROCEPHIN) 2 g in sodium chloride 0.9 % 100 mL IVPB  2 g Intravenous Q24H Daneil Dolin, MD   Stopped at 01/29/18 1532  . citalopram (CELEXA) tablet 40 mg  40 mg Oral Daily Daneil Dolin, MD   40 mg at 01/30/18 1134  . famotidine (PEPCID) tablet 20 mg  20 mg Oral Q breakfast Daneil Dolin, MD   20 mg at 01/30/18 1134  . feeding supplement (BOOST / RESOURCE BREEZE) liquid 1 Container  1 Container Oral TID WC & HS Rourk, Cristopher Estimable, MD   1 Container  at 01/30/18 1132  . hydrALAZINE (APRESOLINE) injection 10 mg  10 mg Intravenous Q4H PRN Daneil Dolin, MD   10 mg at 01/30/18 0647  . morphine 2 MG/ML injection 2 mg  2 mg Intravenous Q2H PRN Daneil Dolin, MD   2 mg at 01/30/18 1134  . nicotine (NICODERM CQ - dosed in mg/24 hours) patch 21 mg  21 mg Transdermal Daily Dhungel, Nishant, MD   21 mg at 01/30/18 1134  . ondansetron (ZOFRAN) injection 4 mg  4 mg Intravenous Q8H PRN Rourk, Cristopher Estimable, MD      . pantoprazole (PROTONIX) EC tablet 40 mg  40 mg Oral Daily Daneil Dolin, MD   40 mg at 01/30/18 1144  . polyethylene glycol (MIRALAX / GLYCOLAX) packet 17 g  17 g Oral Daily Dhungel, Nishant, MD   17 g at 01/30/18 1133  . pramipexole (MIRAPEX) tablet 0.125  mg  0.125 mg Oral QHS Daneil Dolin, MD   0.125 mg at 01/29/18 2158  . tiotropium (SPIRIVA) inhalation capsule 18 mcg  18 mcg Inhalation Daily Daneil Dolin, MD   18 mcg at 01/30/18 0805     Discharge Medications: Please see discharge summary for a list of discharge medications.  Relevant Imaging Results:  Relevant Lab Results:   Additional Information SSN: [id number] [place]

## 2018-01-30 NOTE — Clinical Social Work Note (Addendum)
LCSW notified by PT and RN CM that pt needing SNF rehab with dc planned for today. Spoke with pt's family by phone and they requested referral to Christy Valdez in [place]. Contacted Hydrologist at [facility] and faxed referral as requested. Awaiting return call with determination. Will follow up with pt and family once Christy Valdez returns call.   3:55.  Have been working with pt and family this afternoon regarding placement. Family requested additional referrals to [facility] and [facility]. Both referrals made and neither facility has available beds. Pt accepted at [facility] in [place]. Pt/family accepted this bed offer and asked if they could be on the waiting list for [facility]. Requested this through Epic. Started [place] PASRR screening in the event that pt transfers back to a [place] SNF.   Plan is for transfer to [facility] on Saturday in daughter's vehicle. Daughter indicates she will be here around 11:00. DC clinical will need to be faxed to [phone]. Weekend CSW will be available to assist with pt's dc needs. Report can be called to [phone] push [phone] and ask for supervisor.

## 2018-01-30 NOTE — Plan of Care (Signed)
  Problem: Acute Rehab PT Goals(only PT should resolve) Goal: Pt Will Go Supine/Side To Sit Outcome: Progressing Flowsheets (Taken 01/30/2018 1153) Pt will go Supine/Side to Sit: with supervision Goal: Patient Will Perform Sitting Balance Outcome: Progressing Flowsheets (Taken 01/30/2018 1153) Patient will perform sitting balance: with supervision Goal: Patient Will Transfer Sit To/From Stand Outcome: Progressing Flowsheets (Taken 01/30/2018 1153) Patient will transfer sit to/from stand: with supervision Goal: Pt Will Ambulate Outcome: Progressing Flowsheets (Taken 01/30/2018 1153) Pt will Ambulate: 100 feet;with min guard assist;with rolling walker   11:53 AM, 01/30/18 Lonell Grandchild, MPT Physical Therapist with [facility] [phone] office [phone] mobile phone

## 2018-01-30 NOTE — Progress Notes (Signed)
PROGRESS NOTE                                                                                                                                                                                                             Patient Demographics:    Christy Valdez, is a 82 y.o. female, DOB - 22-Jun-1936, ZJI:[id number]  Admit date - 01/26/2018   Admitting Physician Reubin Milan, MD  Outpatient Primary MD for the patient is Quentin Cornwall, MD  LOS - 3  Outpatient Specialists: GI  Chief Complaint  Patient presents with  . Abdominal Pain       Brief Narrative 82 year old female with history of chronic back pain, COPD with ongoing tobacco use, lumbar DDD, scoliosis, history of esophageal stricture with dilatations, and depression presented to the ED with abdominal pain and distention for [redacted] weeks along with very poor p.o. intake and about 20 pound weight loss.  Also reports significant dysphagia with regurgitation of food.  CT of the abdomen and pelvis done on admission showing abdominal ascites with suspicious omental and mesenteric caking of tumor in the left upper quadrant and along transverse colon mesentery. Patient admitted for further work-up.   Subjective:   Complaining of abdominal discomfort and feeling constipated.  Has been tolerating dysphagia level 2 diet but not able to eat a whole lot.   Assessment  & Plan :    Principal Problem: Abdominal pain with ascites Peritoneal carcinomatosis Suspect carcinomatous underlying lesion causing malignant ascites.  Underwent 2.2 L ascites fluid removal today.  WBC of >1000 and elevated LDH.   I have placed her on empiric IV Rocephin for possible SBP.  Fluid culture negative for growth.  Cytology pending fluid culture negative for growth.  Cytology pending.  (I spoke with the pathologist at [facility] today who was reviewing 8 and informed me that the ascites fluid did show  metastatic carcinoma with a differential of possible breast versus GYN primary.  She was working on more stains and we should have a final result by Monday next week. Elevated Ca 125 (2 and half fold from upper limit) and CEA suspicious of GYN primary. She also has markedly thickened sigmoid colon on CT.  Started on PRN Percocet for pain for discharge and hopefully be able to avoid morphine.  Her abdominal is slowly getting distended  and I suspect she may need repeat paracentesis next week.  I have spoken with oncology Dr Delton Coombes who agreed to see patient as outpatient and will arrange appointment.    Active Problems: Dysphagia with loss of weight EGD done showing moderate Schatzki's ring that was dilated, small hiatal hernia.  No mass noted.  Tolerating dysphagia level 2 diet.  Continue PPI.  COPD with ongoing tobacco use. Stable.  Continue PRN nebs.  Counseled on smoking cessation.  Ordered nicotine patch.  Chronic depression Continue citalopram.  Chronic back pain Continue PRN meds.  Constipation Continue MiraLAX.  Added Senokot.  Generalized weakness Seen by PT and recommends SNF.  Has been accepted by SNF in [facility] for tomorrow.    Code Status : Full code  Family Communication  : Daughter and niece at bedside  Disposition Plan  : SNF in [place] tomorrow.  Barriers For Discharge : Active symptoms  Consults  : GI/radiology/oncology  Procedures  : Ultrasound paracentesis, EGD, CT abdomen and pelvis, ultrasound abdomen  DVT Prophylaxis  :  Lovenox -   Lab Results  Component Value Date   PLT 611 (H) 01/28/2018    Antibiotics  :   Anti-infectives (From admission, onward)   Start     Dose/Rate Route Frequency Ordered Stop   01/28/18 1430  cefTRIAXone (ROCEPHIN) 2 g in sodium chloride 0.9 % 100 mL IVPB     2 g 200 mL/hr over 30 Minutes Intravenous Every 24 hours 01/28/18 1337     01/27/18 0130  amoxicillin (AMOXIL) capsule 500 mg  Status:  Discontinued      500 mg Oral 2 times daily 01/27/18 0115 01/27/18 0937        Objective:   Vitals:   01/30/18 0623 01/30/18 0647 01/30/18 0805 01/30/18 1603  BP: (!) 180/84 (!) 181/83  (!) 165/79  Pulse: 86   73  Resp: 18   16  Temp: 98.2 F (36.8 C)   98.8 F (37.1 C)  TempSrc: Oral   Oral  SpO2: 94%  91% 94%  Weight: 38.1 kg (83 lb 14.4 oz)     Height:        Wt Readings from Last 3 Encounters:  01/30/18 38.1 kg (83 lb 14.4 oz)     Intake/Output Summary (Last 24 hours) at 01/30/2018 1643 Last data filed at 01/30/2018 1544 Gross per 24 hour  Intake 1458.33 ml  Output 700 ml  Net 758.33 ml   Physical exam Elderly cachectic female in no acute distress, fatigue HEENT: Moist mucosa, supple neck Chest: Clear bilaterally CVs: Normal S1 and S2, no murmurs GI: Soft, mild abdominal distention increased from previous day, some tenderness to pressure, bowel sounds present Musculoskeletal: Warm, no edema        Data Review:    CBC Recent Labs  Lab 01/26/18 1645 01/27/18 0413 01/28/18 0408  WBC 10.5 9.5 12.0*  HGB 11.1* 10.8* 11.5*  HCT 34.2* 34.5* 35.9*  PLT 553* 516* 611*  MCV 87.9 88.5 87.1  MCH 28.5 27.7 27.9  MCHC 32.5 31.3 32.0  RDW 13.6 13.6 13.6  LYMPHSABS  --  0.6*  --   MONOABS  --  0.1  --   EOSABS  --  0.0  --   BASOSABS  --  0.0  --     Chemistries  Recent Labs  Lab 01/26/18 1645 01/27/18 0413 01/28/18 0408 01/29/18 0419  NA 133* 136 136 135  K 4.3 4.7 3.9 4.0  CL 95* 100 102 105  CO2 30 28 26 25   GLUCOSE 95 103* 91 84  BUN 20 15 15 13   CREATININE 0.68 0.58 0.59 0.58  CALCIUM 9.1 8.4* 8.5* 8.0*  MG 2.3  --   --   --   AST 19 16 20   --   ALT 12 10 10   --   ALKPHOS 82 77 77  --   BILITOT 0.4 0.3 0.3  --    ------------------------------------------------------------------------------------------------------------------ No results for input(s): CHOL, HDL, LDLCALC, TRIG, CHOLHDL, LDLDIRECT in the last 72 hours.  No results found for:  HGBA1C ------------------------------------------------------------------------------------------------------------------ No results for input(s): TSH, T4TOTAL, T3FREE, THYROIDAB in the last 72 hours.  Invalid input(s): FREET3 ------------------------------------------------------------------------------------------------------------------ No results for input(s): VITAMINB12, FOLATE, FERRITIN, TIBC, IRON, RETICCTPCT in the last 72 hours.  Coagulation profile No results for input(s): INR, PROTIME in the last 168 hours.  No results for input(s): DDIMER in the last 72 hours.  Cardiac Enzymes No results for input(s): CKMB, TROPONINI, MYOGLOBIN in the last 168 hours.  Invalid input(s): CK ------------------------------------------------------------------------------------------------------------------ No results found for: BNP  Inpatient Medications  Scheduled Meds: . citalopram  40 mg Oral Daily  . famotidine  20 mg Oral Q breakfast  . feeding supplement  1 Container Oral TID WC & HS  . nicotine  21 mg Transdermal Daily  . pantoprazole  40 mg Oral Daily  . polyethylene glycol  17 g Oral Daily  . pramipexole  0.125 mg Oral QHS  . senna-docusate  2 tablet Oral BID  . tiotropium  18 mcg Inhalation Daily   Continuous Infusions: . sodium chloride 62.5 mL/hr at 01/30/18 1133  . cefTRIAXone (ROCEPHIN)  IV Stopped (01/30/18 1526)   PRN Meds:.acetaminophen **OR** acetaminophen, albuterol, hydrALAZINE, morphine injection, ondansetron (ZOFRAN) IV, oxyCODONE-acetaminophen  Micro Results Recent Results (from the past 240 hour(s))  Culture, body fluid-bottle     Status: None (Preliminary result)   Collection Time: 01/28/18 10:42 AM  Result Value Ref Range Status   Specimen Description ASCITIC  Final   Special Requests BOTTLES DRAWN AEROBIC AND ANAEROBIC 10CC  Final   Culture   Final    NO GROWTH 2 DAYS Performed at [facility], [address], [place], [place] [address]    Report  Status PENDING  Incomplete  Gram stain     Status: None   Collection Time: 01/28/18 10:42 AM  Result Value Ref Range Status   Specimen Description ASCITIC  Final   Special Requests NONE  Final   Gram Stain   Final    NO ORGANISMS SEEN CYTOSPIN SMEAR WBC PRESENT,BOTH PMN AND MONONUCLEAR Performed at [facility], [address] [place], [place] [address]    Report Status 01/28/2018 FINAL  Final    Radiology Reports Ct Abdomen Pelvis W Contrast  Result Date: 01/26/2018 CLINICAL DATA:  20 pound weight loss over the last month. Central abdominal pain and digestion. EXAM: CT ABDOMEN AND PELVIS WITH CONTRAST TECHNIQUE: Multidetector CT imaging of the abdomen and pelvis was performed using the standard protocol following bolus administration of intravenous contrast. CONTRAST:  198mL ISOVUE-300 IOPAMIDOL (ISOVUE-300) INJECTION 61% COMPARISON:  None. FINDINGS: Lower chest: Small to moderate-sized hiatal hernia.  Cachexia. Hepatobiliary: 4 mm hypodense lesion in the lateral segment left hepatic lobe on image 13/2. 6 mm hypodense lesion in the right hepatic lobe on image 18/2. Probable tiny gallstone in the gallbladder, 2 mm in diameter on image 26/2. Borderline gallbladder wall thickening. Mild periportal edema.  No biliary dilatation. Pancreas: Upper normal caliber of the dorsal  pancreatic duct. Otherwise unremarkable. Spleen: Unremarkable Adrenals/Urinary Tract: Adrenal glands unremarkable. 7 mm hypodense lesion of the right mid kidney, likely a benign cyst but technically too small to characterize. No appreciable hydronephrosis or urinary tract calculus. Stomach/Bowel: The bowel is suspended in prominent ascites. Prominent wall thickening in the sigmoid colon, tumor not excluded. Vascular/Lymphatic: Aortoiliac atherosclerotic vascular disease. Reproductive: Ill definition of the uterus, query hysterectomy. Ovaries indistinct. Other: Prominent ascites. Enhancing and some calcified margins of the pelvic  portion of the ascites posteriorly for example on images 55-61 of series 2. Suspected tumor deposition along the left upper quadrant omentum. Probable thick tumor throughout the transverse colon and splenic flexure mesentery. Musculoskeletal: Dextroconvex lumbar scoliosis. Lumbar spondylosis and degenerative disc disease including a notable disc protrusion at the L3-4 level. Degenerative arthropathy of both hips. IMPRESSION: 1. Prominent ascites with enhancing margins especially posteriorly in the pelvis, and suspected omental and mesenteric caking of tumor in the left upper quadrant and along the transverse colon mesentery. Appearance highly suspicious for malignancy, common etiologies would be from mucinous colon cancer or ovarian cancer. The ovaries are poorly visualized. Peritonitis is a less likely differential diagnostic consideration given the overall appearance. Correlate with serologic tumor markers. 2. Marked wall thickening in the sigmoid colon could be from diverticulosis or tumor. 3. Cachexia. 4. Other imaging findings of potential clinical significance: Small to moderate-sized hiatal hernia. Possible gallstone. Several tiny hepatic lesions are technically nonspecific. Mild periportal edema. Aortic Atherosclerosis (ICD10-I70.0). Dextroconvex lumbar scoliosis. Lumbar spondylosis and degenerative disc disease. Electronically Signed   By: Van Clines M.D.   On: 01/26/2018 21:38   US Paracentesis  Result Date: 01/28/2018 INDICATION: Peritoneal carcinomatosis by CT EXAM: ULTRASOUND GUIDED DIAGNOSTIC AND THERAPEUTIC PARACENTESIS MEDICATIONS: None. COMPLICATIONS: None immediate. PROCEDURE: Procedure, benefits, and risks of procedure were discussed with patient. Written informed consent for procedure was obtained. Time out protocol followed. Adequate collection of ascites localized by ultrasound in RIGHT lower quadrant. Skin prepped and draped in usual sterile fashion. Skin and soft tissues  anesthetized with 10 mL of 1% lidocaine. 5 [place] Yueh catheter placed into peritoneal cavity. 2.2 L of yellow ascitic fluid aspirated by vacuum bottle suction. Procedure tolerated well by patient without immediate complication. FINDINGS: A total of approximately 2.2 L of ascitic fluid was removed. Samples were sent to the laboratory as requested by the clinical team. IMPRESSION: Successful ultrasound-guided paracentesis yielding 2.2 liters of peritoneal fluid. Electronically Signed   By: Lavonia Dana M.D.   On: 01/28/2018 11:52   US Abdomen Limited Ruq  Result Date: 01/27/2018 CLINICAL DATA:  Pancreatitis, 20 pounds weight loss EXAM: ULTRASOUND ABDOMEN LIMITED RIGHT UPPER QUADRANT COMPARISON:  CT abdomen and pelvis 01/26/2018 FINDINGS: Gallbladder: Upper normal gallbladder wall thickness. Tiny probable gallbladder polyp 3 mm diameter. No definite shadowing calculi, wall thickening, or sonographic Murphy sign. Common bile duct: Diameter: 4 mm diameter, normal Liver: Normal echogenicity. No definite hepatic mass or nodularity. Portal vein is patent on color Doppler imaging with normal direction of blood flow towards the liver. Significant ascites. Noted RIGHT pleural effusion. IMPRESSION: Significant ascites and small RIGHT pleural effusion. Tiny gallbladder polyp 3 mm diameter without definite gallstones or biliary dilatation. Electronically Signed   By: Lavonia Dana M.D.   On: 01/27/2018 10:28    Time Spent in minutes  25   Alisen Marsiglia M.D on 01/30/2018 at 4:43 PM  Between 7am to 7pm - Pager - [phone]  After 7pm go to [email or web address] - password [email or web address]  [facility] -  Office  [phone]

## 2018-01-31 MED ORDER — MORPHINE SULFATE (PF) 2 MG/ML IV SOLN
2.0000 mg | INTRAVENOUS | Status: DC | PRN
  Administered 2018-02-01: 2 mg via INTRAVENOUS
  Filled 2018-01-31: qty 1

## 2018-01-31 MED ORDER — BISACODYL 10 MG RE SUPP
10.0000 mg | Freq: Once | RECTAL | Status: AC
  Administered 2018-01-31: 10 mg via RECTAL
  Filled 2018-01-31: qty 1

## 2018-01-31 MED ORDER — SPIRONOLACTONE 25 MG PO TABS
12.5000 mg | ORAL_TABLET | Freq: Every day | ORAL | Status: DC
Start: 2018-01-31 — End: 2018-02-02
  Administered 2018-01-31 – 2018-02-02 (×3): 12.5 mg via ORAL
  Filled 2018-01-31 (×6): qty 1

## 2018-01-31 MED ORDER — FUROSEMIDE 20 MG PO TABS
20.0000 mg | ORAL_TABLET | Freq: Every day | ORAL | Status: DC
  Administered 2018-01-31 – 2018-02-02 (×3): 20 mg via ORAL
  Filled 2018-01-31 (×3): qty 1

## 2018-01-31 MED ORDER — OXYCODONE-ACETAMINOPHEN 5-325 MG PO TABS
1.0000 | ORAL_TABLET | ORAL | Status: DC | PRN
  Administered 2018-01-31: 2 via ORAL
  Administered 2018-02-01 – 2018-02-02 (×3): 1 via ORAL
  Filled 2018-01-31: qty 1
  Filled 2018-01-31: qty 2
  Filled 2018-01-31 (×2): qty 1

## 2018-01-31 NOTE — Progress Notes (Signed)
PROGRESS NOTE                                                                                                                                                                                                             Patient Demographics:    Christy Valdez, is a 82 y.o. female, DOB - 27-Mar-1936, YPP:[id number]  Admit date - 01/26/2018   Admitting Physician Reubin Milan, MD  Outpatient Primary MD for the patient is Quentin Cornwall, MD  LOS - 4  Outpatient Specialists: GI  Chief Complaint  Patient presents with  . Abdominal Pain       Brief Narrative 82 year old female with history of chronic back pain, COPD with ongoing tobacco use, lumbar DDD, scoliosis, history of esophageal stricture with dilatations, and depression presented to the ED with abdominal pain and distention for [redacted] weeks along with very poor p.o. intake and about 20 pound weight loss.  Also reports significant dysphagia with regurgitation of food.  CT of the abdomen and pelvis done on admission showing abdominal ascites with suspicious omental and mesenteric caking of tumor in the left upper quadrant and along transverse colon mesentery. Patient admitted for further work-up.   Subjective:   Feels her abdomen is becoming more distended.  She has not had a bowel movement in several days.   Assessment  & Plan :    Principal Problem: Abdominal pain with ascites Peritoneal carcinomatosis Suspect carcinomatous underlying lesion causing malignant ascites.  Underwent 2.2 L ascites fluid removal on 7/17.  WBC of >1000 and elevated LDH.  She was started on empiric IV Rocephin for possible SBP.  Fluid culture negative for growth.  Cytology pending.  Preliminary report from pathologist indicates metastatic carcinoma with a differential of possible breast versus GYN primary.  She was working on more stains and we should have a final result by Monday next  week. Elevated Ca 125 (2 and half fold from upper limit) and CEA suspicious of GYN primary. She also has markedly thickened sigmoid colon on CT.  Started on PRN Percocet for pain for discharge and hopefully be able to avoid morphine.  Her abdomen is starting to become distended again and she is becoming uncomfortable.  Will discontinue further IV fluids.  Start on low-dose diuretics.  She may need paracentesis prior to discharge.  We will need to  monitor for now.  I have spoken with oncology Dr Delton Coombes who agreed to see patient as outpatient and will arrange appointment.    Active Problems: Dysphagia with loss of weight EGD done showing moderate Schatzki's ring that was dilated, small hiatal hernia.  No mass noted.  Tolerating dysphagia level 2 diet.  Continue PPI.  COPD with ongoing tobacco use. Stable.  Continue PRN nebs.  Counseled on smoking cessation.  Ordered nicotine patch.  Chronic depression Continue citalopram.  Chronic back pain Continue PRN meds.  Constipation Continue MiraLAX.  Added Senokot.  Generalized weakness Seen by PT and recommends SNF.  Has been accepted by SNF in [place] when stable.    Code Status : Full code  Family Communication  : Daughter and niece at bedside  Disposition Plan  : SNF in [place] when stable  Barriers For Discharge : Active symptoms  Consults  : GI/radiology/oncology  Procedures  : Ultrasound paracentesis, EGD, CT abdomen and pelvis, ultrasound abdomen  DVT Prophylaxis  :  Lovenox -   Lab Results  Component Value Date   PLT 611 (H) 01/28/2018    Antibiotics  :   Anti-infectives (From admission, onward)   Start     Dose/Rate Route Frequency Ordered Stop   01/28/18 1430  cefTRIAXone (ROCEPHIN) 2 g in sodium chloride 0.9 % 100 mL IVPB     2 g 200 mL/hr over 30 Minutes Intravenous Every 24 hours 01/28/18 1337     01/27/18 0130  amoxicillin (AMOXIL) capsule 500 mg  Status:  Discontinued     500 mg Oral 2 times daily  01/27/18 0115 01/27/18 0937        Objective:   Vitals:   01/30/18 2331 01/31/18 0519 01/31/18 0836 01/31/18 1424  BP: (!) 157/78 (!) 148/86  (!) 146/85  Pulse: 86 80  69  Resp:  16  18  Temp:  98.1 F (36.7 C)  (!) 97.5 F (36.4 C)  TempSrc:  Oral  Oral  SpO2:  95% 94% 97%  Weight:      Height:        Wt Readings from Last 3 Encounters:  01/30/18 38.1 kg (83 lb 14.4 oz)     Intake/Output Summary (Last 24 hours) at 01/31/2018 1730 Last data filed at 01/31/2018 1500 Gross per 24 hour  Intake 480 ml  Output 400 ml  Net 80 ml   Physical exam General exam: Alert, awake, oriented x 3 Respiratory system: Clear to auscultation. Respiratory effort normal. Cardiovascular system:RRR. No murmurs, rubs, gallops. Gastrointestinal system: Abdomen is distended, soft and mild tenderness throughout abdomen. No organomegaly or masses felt. Normal bowel sounds heard. Central nervous system: Alert and oriented. No focal neurological deficits. Extremities: 1+ edema bilaterally Skin: No rashes, lesions or ulcers Psychiatry: Judgement and insight appear normal. Mood & affect appropriate.    Data Review:    CBC Recent Labs  Lab 01/26/18 1645 01/27/18 0413 01/28/18 0408  WBC 10.5 9.5 12.0*  HGB 11.1* 10.8* 11.5*  HCT 34.2* 34.5* 35.9*  PLT 553* 516* 611*  MCV 87.9 88.5 87.1  MCH 28.5 27.7 27.9  MCHC 32.5 31.3 32.0  RDW 13.6 13.6 13.6  LYMPHSABS  --  0.6*  --   MONOABS  --  0.1  --   EOSABS  --  0.0  --   BASOSABS  --  0.0  --     Chemistries  Recent Labs  Lab 01/26/18 1645 01/27/18 0413 01/28/18 0408 01/29/18 0419  NA 133* 136  136 135  K 4.3 4.7 3.9 4.0  CL 95* 100 102 105  CO2 30 28 26 25   GLUCOSE 95 103* 91 84  BUN 20 15 15 13   CREATININE 0.68 0.58 0.59 0.58  CALCIUM 9.1 8.4* 8.5* 8.0*  MG 2.3  --   --   --   AST 19 16 20   --   ALT 12 10 10   --   ALKPHOS 82 77 77  --   BILITOT 0.4 0.3 0.3  --     ------------------------------------------------------------------------------------------------------------------ No results for input(s): CHOL, HDL, LDLCALC, TRIG, CHOLHDL, LDLDIRECT in the last 72 hours.  No results found for: HGBA1C ------------------------------------------------------------------------------------------------------------------ No results for input(s): TSH, T4TOTAL, T3FREE, THYROIDAB in the last 72 hours.  Invalid input(s): FREET3 ------------------------------------------------------------------------------------------------------------------ No results for input(s): VITAMINB12, FOLATE, FERRITIN, TIBC, IRON, RETICCTPCT in the last 72 hours.  Coagulation profile No results for input(s): INR, PROTIME in the last 168 hours.  No results for input(s): DDIMER in the last 72 hours.  Cardiac Enzymes No results for input(s): CKMB, TROPONINI, MYOGLOBIN in the last 168 hours.  Invalid input(s): CK ------------------------------------------------------------------------------------------------------------------ No results found for: BNP  Inpatient Medications  Scheduled Meds: . citalopram  40 mg Oral Daily  . famotidine  20 mg Oral Q breakfast  . feeding supplement  1 Container Oral TID WC & HS  . furosemide  20 mg Oral Daily  . nicotine  21 mg Transdermal Daily  . pantoprazole  40 mg Oral Daily  . polyethylene glycol  17 g Oral Daily  . pramipexole  0.125 mg Oral QHS  . senna-docusate  2 tablet Oral BID  . spironolactone  12.5 mg Oral Daily  . tiotropium  18 mcg Inhalation Daily   Continuous Infusions: . cefTRIAXone (ROCEPHIN)  IV Stopped (01/31/18 1431)   PRN Meds:.acetaminophen **OR** acetaminophen, albuterol, hydrALAZINE, morphine injection, ondansetron (ZOFRAN) IV, oxyCODONE-acetaminophen  Micro Results Recent Results (from the past 240 hour(s))  Culture, body fluid-bottle     Status: None (Preliminary result)   Collection Time: 01/28/18 10:42 AM   Result Value Ref Range Status   Specimen Description ASCITIC  Final   Special Requests BOTTLES DRAWN AEROBIC AND ANAEROBIC 10CC  Final   Culture   Final    NO GROWTH 3 DAYS Performed at [facility], [address] [place], [place] [address]    Report Status PENDING  Incomplete  Gram stain     Status: None   Collection Time: 01/28/18 10:42 AM  Result Value Ref Range Status   Specimen Description ASCITIC  Final   Special Requests NONE  Final   Gram Stain   Final    NO ORGANISMS SEEN CYTOSPIN SMEAR WBC PRESENT,BOTH PMN AND MONONUCLEAR Performed at [facility], [address] [place], [place] [address]    Report Status 01/28/2018 FINAL  Final    Radiology Reports Ct Abdomen Pelvis W Contrast  Result Date: 01/26/2018 CLINICAL DATA:  20 pound weight loss over the last month. Central abdominal pain and digestion. EXAM: CT ABDOMEN AND PELVIS WITH CONTRAST TECHNIQUE: Multidetector CT imaging of the abdomen and pelvis was performed using the standard protocol following bolus administration of intravenous contrast. CONTRAST:  147mL ISOVUE-300 IOPAMIDOL (ISOVUE-300) INJECTION 61% COMPARISON:  None. FINDINGS: Lower chest: Small to moderate-sized hiatal hernia.  Cachexia. Hepatobiliary: 4 mm hypodense lesion in the lateral segment left hepatic lobe on image 13/2. 6 mm hypodense lesion in the right hepatic lobe on image 18/2. Probable tiny gallstone in the gallbladder, 2 mm in  diameter on image 26/2. Borderline gallbladder wall thickening. Mild periportal edema.  No biliary dilatation. Pancreas: Upper normal caliber of the dorsal pancreatic duct. Otherwise unremarkable. Spleen: Unremarkable Adrenals/Urinary Tract: Adrenal glands unremarkable. 7 mm hypodense lesion of the right mid kidney, likely a benign cyst but technically too small to characterize. No appreciable hydronephrosis or urinary tract calculus. Stomach/Bowel: The bowel is suspended in prominent ascites. Prominent wall thickening in the  sigmoid colon, tumor not excluded. Vascular/Lymphatic: Aortoiliac atherosclerotic vascular disease. Reproductive: Ill definition of the uterus, query hysterectomy. Ovaries indistinct. Other: Prominent ascites. Enhancing and some calcified margins of the pelvic portion of the ascites posteriorly for example on images 55-61 of series 2. Suspected tumor deposition along the left upper quadrant omentum. Probable thick tumor throughout the transverse colon and splenic flexure mesentery. Musculoskeletal: Dextroconvex lumbar scoliosis. Lumbar spondylosis and degenerative disc disease including a notable disc protrusion at the L3-4 level. Degenerative arthropathy of both hips. IMPRESSION: 1. Prominent ascites with enhancing margins especially posteriorly in the pelvis, and suspected omental and mesenteric caking of tumor in the left upper quadrant and along the transverse colon mesentery. Appearance highly suspicious for malignancy, common etiologies would be from mucinous colon cancer or ovarian cancer. The ovaries are poorly visualized. Peritonitis is a less likely differential diagnostic consideration given the overall appearance. Correlate with serologic tumor markers. 2. Marked wall thickening in the sigmoid colon could be from diverticulosis or tumor. 3. Cachexia. 4. Other imaging findings of potential clinical significance: Small to moderate-sized hiatal hernia. Possible gallstone. Several tiny hepatic lesions are technically nonspecific. Mild periportal edema. Aortic Atherosclerosis (ICD10-I70.0). Dextroconvex lumbar scoliosis. Lumbar spondylosis and degenerative disc disease. Electronically Signed   By: Van Clines M.D.   On: 01/26/2018 21:38   US Paracentesis  Result Date: 01/28/2018 INDICATION: Peritoneal carcinomatosis by CT EXAM: ULTRASOUND GUIDED DIAGNOSTIC AND THERAPEUTIC PARACENTESIS MEDICATIONS: None. COMPLICATIONS: None immediate. PROCEDURE: Procedure, benefits, and risks of procedure were  discussed with patient. Written informed consent for procedure was obtained. Time out protocol followed. Adequate collection of ascites localized by ultrasound in RIGHT lower quadrant. Skin prepped and draped in usual sterile fashion. Skin and soft tissues anesthetized with 10 mL of 1% lidocaine. 5 [place] Yueh catheter placed into peritoneal cavity. 2.2 L of yellow ascitic fluid aspirated by vacuum bottle suction. Procedure tolerated well by patient without immediate complication. FINDINGS: A total of approximately 2.2 L of ascitic fluid was removed. Samples were sent to the laboratory as requested by the clinical team. IMPRESSION: Successful ultrasound-guided paracentesis yielding 2.2 liters of peritoneal fluid. Electronically Signed   By: Lavonia Dana M.D.   On: 01/28/2018 11:52   US Abdomen Limited Ruq  Result Date: 01/27/2018 CLINICAL DATA:  Pancreatitis, 20 pounds weight loss EXAM: ULTRASOUND ABDOMEN LIMITED RIGHT UPPER QUADRANT COMPARISON:  CT abdomen and pelvis 01/26/2018 FINDINGS: Gallbladder: Upper normal gallbladder wall thickness. Tiny probable gallbladder polyp 3 mm diameter. No definite shadowing calculi, wall thickening, or sonographic Murphy sign. Common bile duct: Diameter: 4 mm diameter, normal Liver: Normal echogenicity. No definite hepatic mass or nodularity. Portal vein is patent on color Doppler imaging with normal direction of blood flow towards the liver. Significant ascites. Noted RIGHT pleural effusion. IMPRESSION: Significant ascites and small RIGHT pleural effusion. Tiny gallbladder polyp 3 mm diameter without definite gallstones or biliary dilatation. Electronically Signed   By: Lavonia Dana M.D.   On: 01/27/2018 10:28    Time Spent in minutes  25   Kathie Dike M.D on 01/31/2018 at 5:30 PM  Between 7am to 7pm - Pager - [phone]  After 7pm go to [email or web address] - password [facility]  [facility] -  Office  [phone]

## 2018-02-01 MED ORDER — LINACLOTIDE 145 MCG PO CAPS
145.0000 ug | ORAL_CAPSULE | Freq: Every day | ORAL | Status: DC
  Administered 2018-02-02: 145 ug via ORAL
  Filled 2018-02-01: qty 1

## 2018-02-01 MED ORDER — MAGNESIUM HYDROXIDE 400 MG/5ML PO SUSP
960.0000 mL | Freq: Once | ORAL | Status: AC
  Administered 2018-02-01: 960 mL via RECTAL
  Filled 2018-02-01: qty 473

## 2018-02-01 NOTE — Progress Notes (Signed)
PROGRESS NOTE                                                                                                                                                                                                             Patient Demographics:    Christy Valdez, is a 82 y.o. female, DOB - 1935/12/20, OIZ:[id number]  Admit date - 01/26/2018   Admitting Physician Reubin Milan, MD  Outpatient Primary MD for the patient is Quentin Cornwall, MD  LOS - 5  Outpatient Specialists: GI  Chief Complaint  Patient presents with  . Abdominal Pain       Brief Narrative 82 year old female with history of chronic back pain, COPD with ongoing tobacco use, lumbar DDD, scoliosis, history of esophageal stricture with dilatations, and depression presented to the ED with abdominal pain and distention for [redacted] weeks along with very poor p.o. intake and about 20 pound weight loss.  Also reports significant dysphagia with regurgitation of food.  CT of the abdomen and pelvis done on admission showing abdominal ascites with suspicious omental and mesenteric caking of tumor in the left upper quadrant and along transverse colon mesentery. Patient admitted for further work-up.   Subjective:   Feels better abdomen still distended.  Feels uncomfortable.  No vomiting.  Has not had a bowel movement yet.   Assessment  & Plan :    Principal Problem: Abdominal pain with ascites Peritoneal carcinomatosis Suspect carcinomatous underlying lesion causing malignant ascites.  Underwent 2.2 L ascites fluid removal on 7/17.  WBC of >1000 and elevated LDH.  She was started on empiric IV Rocephin for possible SBP.  Fluid culture negative for growth.  Cytology pending.  Preliminary report from pathologist indicates metastatic adenocarcinoma with a differential of possible breast versus GYN primary.  She was working on more stains and we should have a final result by Monday  next week. Elevated Ca 125 (2 and half fold from upper limit) and CEA suspicious of GYN primary. She also has markedly thickened sigmoid colon on CT.  Started on PRN Percocet for pain for discharge and hopefully be able to avoid morphine.  Her abdomen is starting to become distended again and she is becoming uncomfortable. Started on low-dose diuretics.  Will request repeat paracentesis in a.m.  Case was discussed with oncology Dr Delton Coombes who agreed to see  patient as outpatient and will arrange appointment.    Active Problems: Dysphagia with loss of weight EGD done showing moderate Schatzki's ring that was dilated, small hiatal hernia.  No mass noted.  Tolerating dysphagia level 2 diet.  Continue PPI.  COPD with ongoing tobacco use. Stable.  Continue PRN nebs.  Counseled on smoking cessation.  Ordered nicotine patch.  Chronic depression Continue citalopram.  Chronic back pain Continue PRN meds.  Constipation Continue MiraLAX.  She did not have any significant bowel movement after receiving Dulcolax suppository.  We will give her an enema today and start on Linzess.  Generalized weakness Seen by PT and recommends SNF.  Has been accepted by SNF in [place] when stable.    Code Status : Full code  Family Communication  : No family at bedside.  Discussed with son-in-law over the phone with patient's permission  Disposition Plan  : SNF in [place] when stable  Barriers For Discharge : Active symptoms  Consults  : GI/radiology/oncology  Procedures  : Ultrasound paracentesis, EGD, CT abdomen and pelvis, ultrasound abdomen  DVT Prophylaxis  :  Lovenox -   Lab Results  Component Value Date   PLT 611 (H) 01/28/2018    Antibiotics  :   Anti-infectives (From admission, onward)   Start     Dose/Rate Route Frequency Ordered Stop   01/28/18 1430  cefTRIAXone (ROCEPHIN) 2 g in sodium chloride 0.9 % 100 mL IVPB     2 g 200 mL/hr over 30 Minutes Intravenous Every 24 hours  01/28/18 1337     01/27/18 0130  amoxicillin (AMOXIL) capsule 500 mg  Status:  Discontinued     500 mg Oral 2 times daily 01/27/18 0115 01/27/18 0937        Objective:   Vitals:   01/31/18 0836 01/31/18 1424 01/31/18 2208 02/01/18 0500  BP:  (!) 146/85 (!) 166/70 (!) 152/69  Pulse:  69 74 78  Resp:  18 15 15   Temp:  (!) 97.5 F (36.4 C) 98.3 F (36.8 C) 98.4 F (36.9 C)  TempSrc:  Oral Oral Oral  SpO2: 94% 97% 93% 92%  Weight:      Height:        Wt Readings from Last 3 Encounters:  01/30/18 38.1 kg (83 lb 14.4 oz)     Intake/Output Summary (Last 24 hours) at 02/01/2018 1641 Last data filed at 01/31/2018 2125 Gross per 24 hour  Intake 360 ml  Output 50 ml  Net 310 ml   Physical exam General exam: Alert, awake, oriented x 3 Respiratory system: Clear to auscultation. Respiratory effort normal. Cardiovascular system:RRR. No murmurs, rubs, gallops. Gastrointestinal system: Abdomen is distended, soft and diffusely tender. No organomegaly or masses felt. Normal bowel sounds heard. Central nervous system: Alert and oriented. No focal neurological deficits. Extremities: No C/C/E, +pedal pulses Skin: No rashes, lesions or ulcers Psychiatry: Judgement and insight appear normal. Mood & affect appropriate.     Data Review:    CBC Recent Labs  Lab 01/26/18 1645 01/27/18 0413 01/28/18 0408  WBC 10.5 9.5 12.0*  HGB 11.1* 10.8* 11.5*  HCT 34.2* 34.5* 35.9*  PLT 553* 516* 611*  MCV 87.9 88.5 87.1  MCH 28.5 27.7 27.9  MCHC 32.5 31.3 32.0  RDW 13.6 13.6 13.6  LYMPHSABS  --  0.6*  --   MONOABS  --  0.1  --   EOSABS  --  0.0  --   BASOSABS  --  0.0  --  Chemistries  Recent Labs  Lab 01/26/18 1645 01/27/18 0413 01/28/18 0408 01/29/18 0419  NA 133* 136 136 135  K 4.3 4.7 3.9 4.0  CL 95* 100 102 105  CO2 30 28 26 25   GLUCOSE 95 103* 91 84  BUN 20 15 15 13   CREATININE 0.68 0.58 0.59 0.58  CALCIUM 9.1 8.4* 8.5* 8.0*  MG 2.3  --   --   --   AST 19 16 20   --    ALT 12 10 10   --   ALKPHOS 82 77 77  --   BILITOT 0.4 0.3 0.3  --    ------------------------------------------------------------------------------------------------------------------ No results for input(s): CHOL, HDL, LDLCALC, TRIG, CHOLHDL, LDLDIRECT in the last 72 hours.  No results found for: HGBA1C ------------------------------------------------------------------------------------------------------------------ No results for input(s): TSH, T4TOTAL, T3FREE, THYROIDAB in the last 72 hours.  Invalid input(s): FREET3 ------------------------------------------------------------------------------------------------------------------ No results for input(s): VITAMINB12, FOLATE, FERRITIN, TIBC, IRON, RETICCTPCT in the last 72 hours.  Coagulation profile No results for input(s): INR, PROTIME in the last 168 hours.  No results for input(s): DDIMER in the last 72 hours.  Cardiac Enzymes No results for input(s): CKMB, TROPONINI, MYOGLOBIN in the last 168 hours.  Invalid input(s): CK ------------------------------------------------------------------------------------------------------------------ No results found for: BNP  Inpatient Medications  Scheduled Meds: . citalopram  40 mg Oral Daily  . famotidine  20 mg Oral Q breakfast  . feeding supplement  1 Container Oral TID WC & HS  . furosemide  20 mg Oral Daily  . [START ON 02/02/2018] linaclotide  145 mcg Oral QAC breakfast  . nicotine  21 mg Transdermal Daily  . pantoprazole  40 mg Oral Daily  . polyethylene glycol  17 g Oral Daily  . pramipexole  0.125 mg Oral QHS  . senna-docusate  2 tablet Oral BID  . sorbitol, milk of mag, mineral oil, glycerin (SMOG) enema  960 mL Rectal Once  . spironolactone  12.5 mg Oral Daily  . tiotropium  18 mcg Inhalation Daily   Continuous Infusions: . cefTRIAXone (ROCEPHIN)  IV 2 g (02/01/18 1406)   PRN Meds:.acetaminophen **OR** acetaminophen, albuterol, hydrALAZINE, morphine injection,  ondansetron (ZOFRAN) IV, oxyCODONE-acetaminophen  Micro Results Recent Results (from the past 240 hour(s))  Culture, body fluid-bottle     Status: None (Preliminary result)   Collection Time: 01/28/18 10:42 AM  Result Value Ref Range Status   Specimen Description ASCITIC  Final   Special Requests BOTTLES DRAWN AEROBIC AND ANAEROBIC 10CC  Final   Culture   Final    NO GROWTH 4 DAYS Performed at [facility], [address], [place], [place] [address]    Report Status PENDING  Incomplete  Gram stain     Status: None   Collection Time: 01/28/18 10:42 AM  Result Value Ref Range Status   Specimen Description ASCITIC  Final   Special Requests NONE  Final   Gram Stain   Final    NO ORGANISMS SEEN CYTOSPIN SMEAR WBC PRESENT,BOTH PMN AND MONONUCLEAR Performed at [facility], [address] [place], [place] [address]    Report Status 01/28/2018 FINAL  Final    Radiology Reports Ct Abdomen Pelvis W Contrast  Result Date: 01/26/2018 CLINICAL DATA:  20 pound weight loss over the last month. Central abdominal pain and digestion. EXAM: CT ABDOMEN AND PELVIS WITH CONTRAST TECHNIQUE: Multidetector CT imaging of the abdomen and pelvis was performed using the standard protocol following bolus administration of intravenous contrast. CONTRAST:  174mL ISOVUE-300 IOPAMIDOL (ISOVUE-300) INJECTION 61% COMPARISON:  None. FINDINGS: Lower chest: Small to moderate-sized hiatal hernia.  Cachexia. Hepatobiliary: 4 mm hypodense lesion in the lateral segment left hepatic lobe on image 13/2. 6 mm hypodense lesion in the right hepatic lobe on image 18/2. Probable tiny gallstone in the gallbladder, 2 mm in diameter on image 26/2. Borderline gallbladder wall thickening. Mild periportal edema.  No biliary dilatation. Pancreas: Upper normal caliber of the dorsal pancreatic duct. Otherwise unremarkable. Spleen: Unremarkable Adrenals/Urinary Tract: Adrenal glands unremarkable. 7 mm hypodense lesion of the right mid kidney,  likely a benign cyst but technically too small to characterize. No appreciable hydronephrosis or urinary tract calculus. Stomach/Bowel: The bowel is suspended in prominent ascites. Prominent wall thickening in the sigmoid colon, tumor not excluded. Vascular/Lymphatic: Aortoiliac atherosclerotic vascular disease. Reproductive: Ill definition of the uterus, query hysterectomy. Ovaries indistinct. Other: Prominent ascites. Enhancing and some calcified margins of the pelvic portion of the ascites posteriorly for example on images 55-61 of series 2. Suspected tumor deposition along the left upper quadrant omentum. Probable thick tumor throughout the transverse colon and splenic flexure mesentery. Musculoskeletal: Dextroconvex lumbar scoliosis. Lumbar spondylosis and degenerative disc disease including a notable disc protrusion at the L3-4 level. Degenerative arthropathy of both hips. IMPRESSION: 1. Prominent ascites with enhancing margins especially posteriorly in the pelvis, and suspected omental and mesenteric caking of tumor in the left upper quadrant and along the transverse colon mesentery. Appearance highly suspicious for malignancy, common etiologies would be from mucinous colon cancer or ovarian cancer. The ovaries are poorly visualized. Peritonitis is a less likely differential diagnostic consideration given the overall appearance. Correlate with serologic tumor markers. 2. Marked wall thickening in the sigmoid colon could be from diverticulosis or tumor. 3. Cachexia. 4. Other imaging findings of potential clinical significance: Small to moderate-sized hiatal hernia. Possible gallstone. Several tiny hepatic lesions are technically nonspecific. Mild periportal edema. Aortic Atherosclerosis (ICD10-I70.0). Dextroconvex lumbar scoliosis. Lumbar spondylosis and degenerative disc disease. Electronically Signed   By: Van Clines M.D.   On: 01/26/2018 21:38   US Paracentesis  Result Date:  01/28/2018 INDICATION: Peritoneal carcinomatosis by CT EXAM: ULTRASOUND GUIDED DIAGNOSTIC AND THERAPEUTIC PARACENTESIS MEDICATIONS: None. COMPLICATIONS: None immediate. PROCEDURE: Procedure, benefits, and risks of procedure were discussed with patient. Written informed consent for procedure was obtained. Time out protocol followed. Adequate collection of ascites localized by ultrasound in RIGHT lower quadrant. Skin prepped and draped in usual sterile fashion. Skin and soft tissues anesthetized with 10 mL of 1% lidocaine. 5 [place] Yueh catheter placed into peritoneal cavity. 2.2 L of yellow ascitic fluid aspirated by vacuum bottle suction. Procedure tolerated well by patient without immediate complication. FINDINGS: A total of approximately 2.2 L of ascitic fluid was removed. Samples were sent to the laboratory as requested by the clinical team. IMPRESSION: Successful ultrasound-guided paracentesis yielding 2.2 liters of peritoneal fluid. Electronically Signed   By: Lavonia Dana M.D.   On: 01/28/2018 11:52   US Abdomen Limited Ruq  Result Date: 01/27/2018 CLINICAL DATA:  Pancreatitis, 20 pounds weight loss EXAM: ULTRASOUND ABDOMEN LIMITED RIGHT UPPER QUADRANT COMPARISON:  CT abdomen and pelvis 01/26/2018 FINDINGS: Gallbladder: Upper normal gallbladder wall thickness. Tiny probable gallbladder polyp 3 mm diameter. No definite shadowing calculi, wall thickening, or sonographic Murphy sign. Common bile duct: Diameter: 4 mm diameter, normal Liver: Normal echogenicity. No definite hepatic mass or nodularity. Portal vein is patent on color Doppler imaging with normal direction of blood flow towards the liver. Significant ascites. Noted RIGHT pleural effusion. IMPRESSION: Significant ascites and small RIGHT pleural  effusion. Tiny gallbladder polyp 3 mm diameter without definite gallstones or biliary dilatation. Electronically Signed   By: Lavonia Dana M.D.   On: 01/27/2018 10:28    Time Spent in minutes   25   Kathie Dike M.D on 02/01/2018 at 4:41 PM  Between 7am to 7pm - Pager - [phone]  After 7pm go to [email or web address] - password [facility]  [facility] -  Office  [phone]

## 2018-02-02 ENCOUNTER — Inpatient Hospital Stay (HOSPITAL_COMMUNITY): Payer: Medicare Other

## 2018-02-02 DIAGNOSIS — R180 Malignant ascites: Secondary | ICD-10-CM

## 2018-02-02 LAB — CULTURE, BODY FLUID W GRAM STAIN -BOTTLE: Culture: NO GROWTH

## 2018-02-02 LAB — CULTURE, BODY FLUID-BOTTLE

## 2018-02-02 MED ORDER — MAGNESIUM CITRATE PO SOLN: 1.0000 | Freq: Every day | ORAL | Status: AC | PRN

## 2018-02-02 MED ORDER — CITALOPRAM HYDROBROMIDE 40 MG PO TABS: 20.0000 mg | ORAL_TABLET | Freq: Every day | ORAL | Status: AC

## 2018-02-02 MED ORDER — SPIRONOLACTONE 25 MG PO TABS: 12.5000 mg | ORAL_TABLET | Freq: Every day | ORAL | Status: AC

## 2018-02-02 MED ORDER — FUROSEMIDE 20 MG PO TABS: 20.0000 mg | ORAL_TABLET | Freq: Every day | ORAL | 0 refills | Status: AC

## 2018-02-02 MED ORDER — OXYCODONE-ACETAMINOPHEN 5-325 MG PO TABS
1.0000 | ORAL_TABLET | ORAL | 0 refills | Status: AC | PRN
Start: 2018-02-02 — End: ?

## 2018-02-02 MED ORDER — CITALOPRAM HYDROBROMIDE 20 MG PO TABS: 20.0000 mg | ORAL_TABLET | Freq: Every day | ORAL | Status: DC

## 2018-02-02 MED ORDER — PANTOPRAZOLE SODIUM 40 MG PO TBEC
40.0000 mg | DELAYED_RELEASE_TABLET | Freq: Every day | ORAL | Status: AC
Start: 2018-02-03 — End: ?

## 2018-02-02 MED ORDER — SENNOSIDES-DOCUSATE SODIUM 8.6-50 MG PO TABS: 2.0000 | ORAL_TABLET | Freq: Two times a day (BID) | ORAL | Status: AC

## 2018-02-02 MED ORDER — LINACLOTIDE 145 MCG PO CAPS
145.0000 ug | ORAL_CAPSULE | Freq: Every day | ORAL | Status: AC
Start: 2018-02-03 — End: ?

## 2018-02-02 MED ORDER — POLYETHYLENE GLYCOL 3350 17 G PO PACK: 17.0000 g | PACK | Freq: Every day | ORAL | 0 refills | Status: AC

## 2018-02-02 NOTE — Progress Notes (Signed)
Report called to receiving nurse, Constance Holster, at [facility].

## 2018-02-02 NOTE — Progress Notes (Signed)
Christy Valdez discharged to [facility] per MD order.  Discharge instructions reviewed and discussed with the patient, all questions and concerns answered. Copy of instructions and scripts given to patient. Report called previously to facility.   Allergies as of 02/02/2018      Reactions   Codeine Nausea And Vomiting      Medication List    STOP taking these medications   amoxicillin 500 MG capsule Commonly known as:  AMOXIL   traMADol 50 MG tablet Commonly known as:  ULTRAM     TAKE these medications   albuterol (2.5 MG/3ML) 0.083% nebulizer solution Commonly known as:  PROVENTIL Take 2.5 mg by nebulization every 6 (six) hours as needed for wheezing or shortness of breath.   citalopram 40 MG tablet Commonly known as:  CELEXA Take 0.5 tablets (20 mg total) by mouth daily. What changed:  how much to take   furosemide 20 MG tablet Commonly known as:  LASIX Take 1 tablet (20 mg total) by mouth daily. Start taking on:  02/03/2018   linaclotide 145 MCG Caps capsule Commonly known as:  LINZESS Take 1 capsule (145 mcg total) by mouth daily before breakfast. Start taking on:  02/03/2018   magnesium citrate Soln Take 296 mLs (1 Bottle total) by mouth daily as needed for severe constipation.   oxyCODONE-acetaminophen 5-325 MG tablet Commonly known as:  PERCOCET/ROXICET Take 1-2 tablets by mouth every 4 (four) hours as needed for moderate pain.   pantoprazole 40 MG tablet Commonly known as:  PROTONIX Take 1 tablet (40 mg total) by mouth daily. Start taking on:  02/03/2018   polyethylene glycol packet Commonly known as:  MIRALAX / GLYCOLAX Take 17 g by mouth daily. Start taking on:  02/03/2018   pramipexole 0.125 MG tablet Commonly known as:  MIRAPEX Take 0.125 mg by mouth at bedtime.   senna-docusate 8.6-50 MG tablet Commonly known as:  Senokot-S Take 2 tablets by mouth 2 (two) times daily.   SPIRIVA HANDIHALER 18 MCG inhalation capsule Generic drug:   tiotropium Place 18 mcg into inhaler and inhale daily.   spironolactone 25 MG tablet Commonly known as:  ALDACTONE Take 0.5 tablets (12.5 mg total) by mouth daily. Start taking on:  02/03/2018        IV site discontinued and catheter remains intact. Site without signs and symptoms of complications. Dressing and pressure applied.  Patient escorted to car in a wheelchair,  no distress noted upon discharge. Daughter is transporting patient to facility.  Christy Valdez Christy Valdez 02/02/2018 2:47 PM

## 2018-02-02 NOTE — Discharge Summary (Signed)
Physician Discharge Summary  Daily Doe TKZ:[id number] DOB: 1935/09/25 DOA: 01/26/2018  PCP: Quentin Cornwall, MD  Admit date: 01/26/2018 Discharge date: 02/02/2018  Admitted From: home Disposition:  SNF  Recommendations for Outpatient Follow-up:  1. Follow up with PCP in 1-2 weeks 2. Please obtain BMP/CBC in one week 3. Follow up with oncology on 7/26 at 1:00pm  Discharge Condition:stabe CODE STATUS:full code Diet recommendation: regular diet, dysphagia 2 with thin liquids  Brief/Interim Summary: 82 year old female with history of chronic back pain, COPD with ongoing tobacco use, lumbar DDD, scoliosis, history of esophageal stricture with dilatations, and depression presented to the ED with abdominal pain and distention for [redacted] weeks along with very poor p.o. intake and about 20 pound weight loss.  Also reports significant dysphagia with regurgitation of food.  CT of the abdomen and pelvis done on admission showing abdominal ascites with suspicious omental and mesenteric caking of tumor in the left upper quadrant and along transverse colon mesentery. Patient admitted for further work-up.    Discharge Diagnoses:  Principal Problem:   Intractable abdominal pain Active Problems:   Chronic back pain   Depression   Tobacco use   Hyponatremia   COPD exacerbation (HCC)   Loss of weight   Peritoneal carcinomatosis (HCC)   Other ascites   Esophageal dysphagia  1. Abdominal pain with ascites due to peritoneal carcinomatosis.  Patient presented with abdominal distention and ascites.  Imaging indicated omental caking and peritoneal deposits consistent with metastatic disease.  She underwent paracentesis with removal of 2.2 L of ascitic fluid.  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  Ca125 was noted to be elevated and staining of ascitic fluid pointed towards GYN primary.  Case was discussed with oncology who will see the patient in the clinic to discuss further work-up/treatment  options.  Follow-up has been arranged for 7/26.  Prior to discharge, she had another ultrasound of her abdomen done but did not have enough fluid to undergo repeat paracentesis.  Her abdominal pain is reasonably treated with Percocet. 2. Dysphasia and weight loss.  Seen by gastroenterology and underwent EGD that showed moderate Schatzki's ring that was dilated.  No masses were noted.  She is continued on a dysphagia level 2 diet.  She is continued on PPI. 3. COPD.  No evidence of wheezing at this time.  Continue on Spiriva and as needed bronchodilators. 4. Constipation.  She is continued on MiraLAX and Senokot.  Linzess is also been added to her regimen.  She can receive mag citrate or Dulcolax suppositories as needed for severe constipation. 5. Depression.  She is continued on citalopram.  Dose adjustment for age to 20 mg daily. 6. Generalized weakness.  Seen by physical therapy recommended skilled nursing facility placement.  Discharge Instructions  Discharge Instructions    Diet - low sodium heart healthy   Complete by:  As directed    Increase activity slowly   Complete by:  As directed      Allergies as of 02/02/2018      Reactions   Codeine Nausea And Vomiting      Medication List    STOP taking these medications   amoxicillin 500 MG capsule Commonly known as:  AMOXIL   traMADol 50 MG tablet Commonly known as:  ULTRAM     TAKE these medications   albuterol (2.5 MG/3ML) 0.083% nebulizer solution Commonly known as:  PROVENTIL Take 2.5 mg by nebulization every 6 (six) hours as needed for wheezing or shortness of breath.  citalopram 40 MG tablet Commonly known as:  CELEXA Take 0.5 tablets (20 mg total) by mouth daily. What changed:  how much to take   furosemide 20 MG tablet Commonly known as:  LASIX Take 1 tablet (20 mg total) by mouth daily. Start taking on:  02/03/2018   linaclotide 145 MCG Caps capsule Commonly known as:  LINZESS Take 1 capsule (145 mcg total) by  mouth daily before breakfast. Start taking on:  02/03/2018   magnesium citrate Soln Take 296 mLs (1 Bottle total) by mouth daily as needed for severe constipation.   oxyCODONE-acetaminophen 5-325 MG tablet Commonly known as:  PERCOCET/ROXICET Take 1-2 tablets by mouth every 4 (four) hours as needed for moderate pain.   pantoprazole 40 MG tablet Commonly known as:  PROTONIX Take 1 tablet (40 mg total) by mouth daily. Start taking on:  02/03/2018   polyethylene glycol packet Commonly known as:  MIRALAX / GLYCOLAX Take 17 g by mouth daily. Start taking on:  02/03/2018   pramipexole 0.125 MG tablet Commonly known as:  MIRAPEX Take 0.125 mg by mouth at bedtime.   senna-docusate 8.6-50 MG tablet Commonly known as:  Senokot-S Take 2 tablets by mouth 2 (two) times daily.   SPIRIVA HANDIHALER 18 MCG inhalation capsule Generic drug:  tiotropium Place 18 mcg into inhaler and inhale daily.   spironolactone 25 MG tablet Commonly known as:  ALDACTONE Take 0.5 tablets (12.5 mg total) by mouth daily. Start taking on:  02/03/2018       Allergies  Allergen Reactions  . Codeine Nausea And Vomiting    Consultations:  Gastroenterology  Interventional radiology  oncology   Procedures/Studies: Ct Abdomen Pelvis W Contrast  Result Date: 01/26/2018 CLINICAL DATA:  20 pound weight loss over the last month. Central abdominal pain and digestion. EXAM: CT ABDOMEN AND PELVIS WITH CONTRAST TECHNIQUE: Multidetector CT imaging of the abdomen and pelvis was performed using the standard protocol following bolus administration of intravenous contrast. CONTRAST:  128mL ISOVUE-300 IOPAMIDOL (ISOVUE-300) INJECTION 61% COMPARISON:  None. FINDINGS: Lower chest: Small to moderate-sized hiatal hernia.  Cachexia. Hepatobiliary: 4 mm hypodense lesion in the lateral segment left hepatic lobe on image 13/2. 6 mm hypodense lesion in the right hepatic lobe on image 18/2. Probable tiny gallstone in the  gallbladder, 2 mm in diameter on image 26/2. Borderline gallbladder wall thickening. Mild periportal edema.  No biliary dilatation. Pancreas: Upper normal caliber of the dorsal pancreatic duct. Otherwise unremarkable. Spleen: Unremarkable Adrenals/Urinary Tract: Adrenal glands unremarkable. 7 mm hypodense lesion of the right mid kidney, likely a benign cyst but technically too small to characterize. No appreciable hydronephrosis or urinary tract calculus. Stomach/Bowel: The bowel is suspended in prominent ascites. Prominent wall thickening in the sigmoid colon, tumor not excluded. Vascular/Lymphatic: Aortoiliac atherosclerotic vascular disease. Reproductive: Ill definition of the uterus, query hysterectomy. Ovaries indistinct. Other: Prominent ascites. Enhancing and some calcified margins of the pelvic portion of the ascites posteriorly for example on images 55-61 of series 2. Suspected tumor deposition along the left upper quadrant omentum. Probable thick tumor throughout the transverse colon and splenic flexure mesentery. Musculoskeletal: Dextroconvex lumbar scoliosis. Lumbar spondylosis and degenerative disc disease including a notable disc protrusion at the L3-4 level. Degenerative arthropathy of both hips. IMPRESSION: 1. Prominent ascites with enhancing margins especially posteriorly in the pelvis, and suspected omental and mesenteric caking of tumor in the left upper quadrant and along the transverse colon mesentery. Appearance highly suspicious for malignancy, common etiologies would be from mucinous colon cancer or  ovarian cancer. The ovaries are poorly visualized. Peritonitis is a less likely differential diagnostic consideration given the overall appearance. Correlate with serologic tumor markers. 2. Marked wall thickening in the sigmoid colon could be from diverticulosis or tumor. 3. Cachexia. 4. Other imaging findings of potential clinical significance: Small to moderate-sized hiatal hernia. Possible  gallstone. Several tiny hepatic lesions are technically nonspecific. Mild periportal edema. Aortic Atherosclerosis (ICD10-I70.0). Dextroconvex lumbar scoliosis. Lumbar spondylosis and degenerative disc disease. Electronically Signed   By: Van Clines M.D.   On: 01/26/2018 21:38   US Paracentesis  Result Date: 01/28/2018 INDICATION: Peritoneal carcinomatosis by CT EXAM: ULTRASOUND GUIDED DIAGNOSTIC AND THERAPEUTIC PARACENTESIS MEDICATIONS: None. COMPLICATIONS: None immediate. PROCEDURE: Procedure, benefits, and risks of procedure were discussed with patient. Written informed consent for procedure was obtained. Time out protocol followed. Adequate collection of ascites localized by ultrasound in RIGHT lower quadrant. Skin prepped and draped in usual sterile fashion. Skin and soft tissues anesthetized with 10 mL of 1% lidocaine. 5 [place] Yueh catheter placed into peritoneal cavity. 2.2 L of yellow ascitic fluid aspirated by vacuum bottle suction. Procedure tolerated well by patient without immediate complication. FINDINGS: A total of approximately 2.2 L of ascitic fluid was removed. Samples were sent to the laboratory as requested by the clinical team. IMPRESSION: Successful ultrasound-guided paracentesis yielding 2.2 liters of peritoneal fluid. Electronically Signed   By: Lavonia Dana M.D.   On: 01/28/2018 11:52   [place] Ascites (abdomen Limited)  Result Date: 02/02/2018 CLINICAL DATA:  Malignant ascites. Paracentesis 5 days ago with continued full sensation, evaluate for therapeutic paracentesis. EXAM: LIMITED ABDOMEN ULTRASOUND FOR ASCITES TECHNIQUE: Limited ultrasound survey for ascites was performed in all four abdominal quadrants. COMPARISON:  01/28/2018 FINDINGS: Overall small ascites, decreased from prior. When the fluid was accumulated into the right abdomen it measures 3 cm in depth from the liver tip to the right lower quadrant. 1 L or less is estimated. Peritoneal nodularity is seen, consistent  with prior CT findings and history of peritoneal carcinomatosis. The findings and images were shared with the patient. I am uncertain how much of the patient's symptoms are related to fluid volume and she decided that the benefits of drainage were less than the procedural risks. IMPRESSION: Small volume ascites, estimated at 1 L or less. Relationship to symptoms is uncertain and the patient deferred paracentesis at this time. Electronically Signed   By: Monte Fantasia M.D.   On: 02/02/2018 10:34   US Abdomen Limited Ruq  Result Date: 01/27/2018 CLINICAL DATA:  Pancreatitis, 20 pounds weight loss EXAM: ULTRASOUND ABDOMEN LIMITED RIGHT UPPER QUADRANT COMPARISON:  CT abdomen and pelvis 01/26/2018 FINDINGS: Gallbladder: Upper normal gallbladder wall thickness. Tiny probable gallbladder polyp 3 mm diameter. No definite shadowing calculi, wall thickening, or sonographic Murphy sign. Common bile duct: Diameter: 4 mm diameter, normal Liver: Normal echogenicity. No definite hepatic mass or nodularity. Portal vein is patent on color Doppler imaging with normal direction of blood flow towards the liver. Significant ascites. Noted RIGHT pleural effusion. IMPRESSION: Significant ascites and small RIGHT pleural effusion. Tiny gallbladder polyp 3 mm diameter without definite gallstones or biliary dilatation. Electronically Signed   By: Lavonia Dana M.D.   On: 01/27/2018 10:28       Subjective: Abdominal pain is reasonably controlled. No shortness of breath  Discharge Exam: Vitals:   02/02/18 0452 02/02/18 0630  BP: (!) 162/72 (!) 166/87  Pulse: 75 78  Resp: 16 16  Temp: 98.4 F (36.9 C) 98.5 F (36.9 C)  SpO2: 93% 93%   Vitals:   02/01/18 1400 02/01/18 2321 02/02/18 0452 02/02/18 0630  BP: (!) 148/72 (!) 172/84 (!) 162/72 (!) 166/87  Pulse: 69 72 75 78  Resp: 16 16 16 16   Temp: (!) 97.3 F (36.3 C) 98.1 F (36.7 C) 98.4 F (36.9 C) 98.5 F (36.9 C)  TempSrc: Oral Oral Oral Oral  SpO2: 94% 94% 93%  93%  Weight:   39.6 kg (87 lb 4.8 oz)   Height:        General: Pt is alert, awake, not in acute distress Cardiovascular: RRR, S1/S2 +, no rubs, no gallops Respiratory: CTA bilaterally, no wheezing, no rhonchi Abdominal: Soft, NT, mildly distended, bowel sounds + Extremities: no edema, no cyanosis    The results of significant diagnostics from this hospitalization (including imaging, microbiology, ancillary and laboratory) are listed below for reference.     Microbiology: Recent Results (from the past 240 hour(s))  Culture, body fluid-bottle     Status: None   Collection Time: 01/28/18 10:42 AM  Result Value Ref Range Status   Specimen Description ASCITIC  Final   Special Requests BOTTLES DRAWN AEROBIC AND ANAEROBIC 10CC  Final   Culture   Final    NO GROWTH 5 DAYS Performed at [facility], [address] [place], [place] [address]    Report Status 02/02/2018 FINAL  Final  Gram stain     Status: None   Collection Time: 01/28/18 10:42 AM  Result Value Ref Range Status   Specimen Description ASCITIC  Final   Special Requests NONE  Final   Gram Stain   Final    NO ORGANISMS SEEN CYTOSPIN SMEAR WBC PRESENT,BOTH PMN AND MONONUCLEAR Performed at [facility], [address], [place], [place] [address]    Report Status 01/28/2018 FINAL  Final     Labs: BNP (last 3 results) No results for input(s): BNP in the last 8760 hours. Basic Metabolic Panel: Recent Labs  Lab 01/26/18 1645 01/27/18 0413 01/28/18 0408 01/29/18 0419  NA 133* 136 136 135  K 4.3 4.7 3.9 4.0  CL 95* 100 102 105  CO2 30 28 26 25   GLUCOSE 95 103* 91 84  BUN 20 15 15 13   CREATININE 0.68 0.58 0.59 0.58  CALCIUM 9.1 8.4* 8.5* 8.0*  MG 2.3  --   --   --   PHOS 3.4  --   --   --    Liver Function Tests: Recent Labs  Lab 01/26/18 1645 01/27/18 0413 01/28/18 0408  AST 19 16 20   ALT 12 10 10   ALKPHOS 82 77 77  BILITOT 0.4 0.3 0.3  PROT 6.8 5.8* 5.8*  ALBUMIN 3.0* 2.5* 2.6*   Recent Labs   Lab 01/26/18 1645 01/27/18 0413 01/28/18 0408  LIPASE 263* 66* 40   No results for input(s): AMMONIA in the last 168 hours. CBC: Recent Labs  Lab 01/26/18 1645 01/27/18 0413 01/28/18 0408  WBC 10.5 9.5 12.0*  NEUTROABS  --  8.9*  --   HGB 11.1* 10.8* 11.5*  HCT 34.2* 34.5* 35.9*  MCV 87.9 88.5 87.1  PLT 553* 516* 611*   Cardiac Enzymes: No results for input(s): CKTOTAL, CKMB, CKMBINDEX, TROPONINI in the last 168 hours. BNP: Invalid input(s): POCBNP CBG: No results for input(s): GLUCAP in the last 168 hours. D-Dimer No results for input(s): DDIMER in the last 72 hours. Hgb A1c No results for input(s): HGBA1C in the last 72 hours. Lipid Profile No results for input(s): CHOL,  HDL, LDLCALC, TRIG, CHOLHDL, LDLDIRECT in the last 72 hours. Thyroid function studies No results for input(s): TSH, T4TOTAL, T3FREE, THYROIDAB in the last 72 hours.  Invalid input(s): FREET3 Anemia work up No results for input(s): VITAMINB12, FOLATE, FERRITIN, TIBC, IRON, RETICCTPCT in the last 72 hours. Urinalysis    Component Value Date/Time   COLORURINE YELLOW 01/26/2018 1430   APPEARANCEUR CLEAR 01/26/2018 1430   LABSPEC 1.017 01/26/2018 1430   PHURINE 7.0 01/26/2018 1430   GLUCOSEU NEGATIVE 01/26/2018 1430   HGBUR NEGATIVE 01/26/2018 1430   BILIRUBINUR NEGATIVE 01/26/2018 1430   KETONESUR NEGATIVE 01/26/2018 1430   PROTEINUR 30 (A) 01/26/2018 1430   NITRITE NEGATIVE 01/26/2018 1430   LEUKOCYTESUR NEGATIVE 01/26/2018 1430   Sepsis Labs Invalid input(s): PROCALCITONIN,  WBC,  LACTICIDVEN Microbiology Recent Results (from the past 240 hour(s))  Culture, body fluid-bottle     Status: None   Collection Time: 01/28/18 10:42 AM  Result Value Ref Range Status   Specimen Description ASCITIC  Final   Special Requests BOTTLES DRAWN AEROBIC AND ANAEROBIC 10CC  Final   Culture   Final    NO GROWTH 5 DAYS Performed at [facility], [address] [place], [place] [address]    Report Status  02/02/2018 FINAL  Final  Gram stain     Status: None   Collection Time: 01/28/18 10:42 AM  Result Value Ref Range Status   Specimen Description ASCITIC  Final   Special Requests NONE  Final   Gram Stain   Final    NO ORGANISMS SEEN CYTOSPIN SMEAR WBC PRESENT,BOTH PMN AND MONONUCLEAR Performed at [facility], [address], [place], [place] [address]    Report Status 01/28/2018 FINAL  Final     Time coordinating discharge: 86mins  SIGNED:   Kathie Dike, MD  [facility] 02/02/2018, 12:18 PM Pager   If 7PM-7AM, please contact night-coverage [email or web address] Password [email or web address]

## 2018-02-02 NOTE — Clinical Social Work Placement (Signed)
   CLINICAL SOCIAL WORK PLACEMENT  NOTE  Date:  02/02/2018  Patient Details  Name: Christy Valdez MRN: [id number] Date of Birth: August 22, 1935  Clinical Social Work is seeking post-discharge placement for this patient at the [place] level of care (*CSW will initial, date and re-position this form in  chart as items are completed):  Yes   Patient/family provided with [facility] Work Department's list of facilities offering this level of care within the geographic area requested by the patient (or if unable, by the patient's family).  Yes   Patient/family informed of their freedom to choose among providers that offer the needed level of care, that participate in Medicare, Medicaid or managed care program needed by the patient, have an available bed and are willing to accept the patient.  Yes   Patient/family informed of [facility]'s ownership interest in [facility] and [facility], as well as of the fact that they are under no obligation to receive care at these facilities.  PASRR submitted to EDS on 01/30/18     PASRR number received on       Existing PASRR number confirmed on       FL2 transmitted to all facilities in geographic area requested by pt/family on 01/30/18     FL2 transmitted to all facilities within larger geographic area on 01/30/18     Patient informed that his/her managed care company has contracts with or will negotiate with certain facilities, including the following:        Yes   Patient/family informed of bed offers received.  Patient chooses bed at [facility]     Physician recommends and patient chooses bed at      Patient to be transferred to [facility] on 02/02/18.  Patient to be transferred to facility by family vehicle     Patient family notified on 02/02/18 of transfer.  Name of family member notified:  Daughter, Tammy     PHYSICIAN       Additional  Comment: Pt stable for dc to [facility] today per MD. Updated pt's daughter who is here and planning to transport pt in her vehicle. Updated Christy at [facility] and faxed dc clinical. Updated pt's RN who will call report. There are no other CSW needs for dc.   _______________________________________________ Shade Flood, LCSW 02/02/2018, 12:58 PM

## 2018-02-02 NOTE — Care Management Important Message (Signed)
Important Message  Patient Details  Name: Christy Valdez MRN: [id number] Date of Birth: [birth year]   Medicare Important Message Given:  Yes    Shelda Altes 02/02/2018, 11:49 AM

## 2018-02-06 ENCOUNTER — Encounter (HOSPITAL_COMMUNITY): Payer: Self-pay | Admitting: Hematology

## 2018-02-06 ENCOUNTER — Inpatient Hospital Stay (HOSPITAL_COMMUNITY): Payer: Medicare Other | Attending: Hematology | Admitting: Hematology

## 2018-02-06 VITALS — BP 131/59 | HR 64 | Resp 18 | Ht 61.0 in | Wt 85.0 lb

## 2018-02-06 DIAGNOSIS — F17210 Nicotine dependence, cigarettes, uncomplicated: Secondary | ICD-10-CM | POA: Insufficient documentation

## 2018-02-06 DIAGNOSIS — C786 Secondary malignant neoplasm of retroperitoneum and peritoneum: Secondary | ICD-10-CM | POA: Diagnosis present

## 2018-02-06 DIAGNOSIS — C9000 Multiple myeloma not having achieved remission: Secondary | ICD-10-CM

## 2018-02-06 DIAGNOSIS — R180 Malignant ascites: Secondary | ICD-10-CM | POA: Diagnosis not present

## 2018-02-06 DIAGNOSIS — D508 Other iron deficiency anemias: Secondary | ICD-10-CM

## 2018-02-06 DIAGNOSIS — C801 Malignant (primary) neoplasm, unspecified: Secondary | ICD-10-CM | POA: Diagnosis not present

## 2018-02-06 DIAGNOSIS — E538 Deficiency of other specified B group vitamins: Secondary | ICD-10-CM

## 2018-02-06 NOTE — Patient Instructions (Signed)
[place] at [facility]  Discharge Instructions:  You were seen by dr. Raliegh Ip  _______________________________________________________________  Thank you for choosing [place] at [facility] to provide your oncology and hematology care.  To afford each patient quality time with our providers, please arrive at least 15 minutes before your scheduled appointment.  You need to re-schedule your appointment if you arrive 10 or more minutes late.  We strive to give you quality time with our providers, and arriving late affects you and other patients whose appointments are after yours.  Also, if you no show three or more times for appointments you may be dismissed from the clinic.  Again, thank you for choosing [facility] hope is that these requests will allow you access to exceptional care and in a timely manner. _______________________________________________________________  If you have questions after your visit, please contact our office at [phone] between the hours of 8:30 a.m. and 5:00 p.m. Voicemails left after 4:30 p.m. will not be returned until the following business day. _______________________________________________________________  For prescription refill requests, have your pharmacy contact our office. _______________________________________________________________  Recommendations made by the consultant and any test results will be sent to your referring physician. _______________________________________________________________

## 2018-02-06 NOTE — Assessment & Plan Note (Signed)
1.  Peritoneal carcinomatosis: - Admitted to the hospital from 01/26/2018 through 02/02/2018 with severe weakness, abdominal distention and 20 pound weight loss over the last 3 months.  Quit smoking 2 weeks ago, smoked 1 pack/day for 50 years. - CT scan of the abdomen and pelvis with contrast dated 01/26/2018 shows prominent ascites with enhancing margins especially posteriorly in the pelvis, suspected omental and mesenteric caking of tumor in the left upper quadrant and along the transverse colon mesentery suspicious for mucinous colon cancer versus ovarian cancer.  There was marked thickening of the sigmoid colon could be from diverticulosis or tumor. -EGD on 01/28/2018 was within normal limits. -Ultrasound paracentesis on 01/28/2018 done with 2.2 L of fluid removed with cytology showing malignant cells consistent with metastatic adenocarcinoma.  This was positive for CK7 and p53.  Negative for TTF-1, CK 20 and CDX 2.  Overall Immuno profile points towards a gynecologic primary. - Patient currently at a rehab facility in [place].  She reports improvement in her weakness slightly. - Will order CT scan of the chest with contrast for staging.  Patient will need a biopsy to confirm her diagnosis.  I will ask interventional radiology to see if they can biopsy the peritoneum.  I will see her back after the biopsy.  2.  Malignant ascites: -She is currently on Lasix 20 mg daily and spironolactone 25 mg daily.  This can be titrated as needed.  3.  Family history: -Sister had metastatic breast cancer.  Another sister had cancer with lymph node in the neck removed.  Patient does not know the type of cancer.  Niece reportedly had colon cancer.  If ovarian cancer is confirmed, she will undergo both somatic and germline mutation testing.

## 2018-02-06 NOTE — Progress Notes (Signed)
[facility] NOTE  Patient Care Team: Quentin Cornwall, MD as PCP - General (Family Medicine)  CHIEF COMPLAINTS/PURPOSE OF CONSULTATION:  Malignant ascites with peritoneal carcinomatosis.  HISTORY OF PRESENTING ILLNESS:  Christy Valdez 82 y.o. female is seen in consultation today for further work-up and management of peritoneal carcinomatosis.  She was noted to have abdominal distention and has lost 20 pounds in the last 1 to 2 months.  She was also getting progressively weaker.  She used to live at her house with her husband.  Husband has Alzheimer's dementia and requires care.  She came to the hospital on 01/26/2018 when a CT scan of the abdomen and pelvis showed peritoneal carcinomatosis with ascites.  2 subcentimeter liver hypodense masses were seen.  Ultrasound paracentesis on 01/28/2018 removed 2.2 L of fluid.  EGD on 01/28/2018 was normal.  She was then discharged to rehab facility in [place].  For the last 2 days she has been doing some exercises.  She is walking with the help of walker.  She thinks her strength has slightly improved.  She does not have good appetite.  Denies any nausea, vomiting, diarrhea.  She does have some constipation for many years.  No headaches or vision changes were reported.  Denies any bleeding per rectum or melena.  She was an active smoker, smoked 1 pack/day for 50 years and quit smoking 2 weeks ago.  She also has chronic back pain for the last 1 year.  She is accompanied by her daughter today.  About 1 month ago, she was able to function fully. Her sister reportedly died of metastatic breast cancer.  Another sister had a cancerous lymph node in the neck removed.  Patient does not know the type.  Her niece had colon cancer.  MEDICAL HISTORY:  Past Medical History:  Diagnosis Date  . Chronic back pain   . COPD (chronic obstructive pulmonary disease) ([place])   . DDD (degenerative disc disease), lumbar   . Depression   . Scoliosis      SURGICAL HISTORY: Past Surgical History:  Procedure Laterality Date  . [place], anterior approach  . COLONOSCOPY WITH ESOPHAGOGASTRODUODENOSCOPY (EGD) AND ESOPHAGEAL DILATION (ED)     remote past, ?2005  . ESOPHAGOGASTRODUODENOSCOPY N/A 01/28/2018   Procedure: ESOPHAGOGASTRODUODENOSCOPY (EGD);  Surgeon: Daneil Dolin, MD;  Location: [facility];  Service: Endoscopy;  Laterality: N/A;  with possible dilation     SOCIAL HISTORY: Social History   Socioeconomic History  . Marital status: Married    Spouse name: Not on file  . Number of children: Not on file  . Years of education: Not on file  . Highest education level: Not on file  Occupational History  . Not on file  Social Needs  . Financial resource strain: Not on file  . Food insecurity:    Worry: Not on file    Inability: Not on file  . Transportation needs:    Medical: Not on file    Non-medical: Not on file  Tobacco Use  . Smoking status: Current Every Day Smoker    Packs/day: 1.00    Types: Cigarettes  . Smokeless tobacco: Never Used  Substance and Sexual Activity  . Alcohol use: Never    Frequency: Never  . Drug use: Never  . Sexual activity: Not on file  Lifestyle  . Physical activity:    Days per week: Not on file    Minutes per session:  Not on file  . Stress: Not on file  Relationships  . Social connections:    Talks on phone: Not on file    Gets together: Not on file    Attends religious service: Not on file    Active member of club or organization: Not on file    Attends meetings of clubs or organizations: Not on file    Relationship status: Not on file  . Intimate partner violence:    Fear of current or ex partner: Not on file    Emotionally abused: Not on file    Physically abused: Not on file    Forced sexual activity: Not on file  Other Topics Concern  . Not on file  Social History Narrative  . Not on file    FAMILY HISTORY: Family History  Problem  Relation Age of Onset  . AAA (abdominal aortic aneurysm) Mother   . CVA Father   . Cancer Sister        Unspecified type.  Marland Kitchen CAD Brother        Died of MI  . Breast cancer Sister   . CAD Brother   . Colon cancer Neg Hx     ALLERGIES:  is allergic to codeine.  MEDICATIONS:  Current Outpatient Medications  Medication Sig Dispense Refill  . albuterol (PROVENTIL) (2.5 MG/3ML) 0.083% nebulizer solution Take 2.5 mg by nebulization every 6 (six) hours as needed for wheezing or shortness of breath.     . citalopram (CELEXA) 40 MG tablet Take 0.5 tablets (20 mg total) by mouth daily.    . furosemide (LASIX) 20 MG tablet Take 1 tablet (20 mg total) by mouth daily. 30 tablet 0  . linaclotide (LINZESS) 145 MCG CAPS capsule Take 1 capsule (145 mcg total) by mouth daily before breakfast. 30 capsule   . magnesium citrate SOLN Take 296 mLs (1 Bottle total) by mouth daily as needed for severe constipation. 195 mL   . oxyCODONE-acetaminophen (PERCOCET/ROXICET) 5-325 MG tablet Take 1-2 tablets by mouth every 4 (four) hours as needed for moderate pain. 30 tablet 0  . pantoprazole (PROTONIX) 40 MG tablet Take 1 tablet (40 mg total) by mouth daily.    . polyethylene glycol (MIRALAX / GLYCOLAX) packet Take 17 g by mouth daily. 14 each 0  . pramipexole (MIRAPEX) 0.125 MG tablet Take 0.125 mg by mouth at bedtime.     . senna-docusate (SENOKOT-S) 8.6-50 MG tablet Take 2 tablets by mouth 2 (two) times daily.    Marland Kitchen SPIRIVA HANDIHALER 18 MCG inhalation capsule Place 18 mcg into inhaler and inhale daily.     Marland Kitchen spironolactone (ALDACTONE) 25 MG tablet Take 0.5 tablets (12.5 mg total) by mouth daily.     No current facility-administered medications for this visit.     REVIEW OF SYSTEMS:   Constitutional: Denies fevers, chills or abnormal night sweats.  Fatigue present. Eyes: Denies blurriness of vision, double vision or watery eyes Ears, nose, mouth, throat, and face: Denies mucositis or sore throat Respiratory:  Denies cough, dyspnea or wheezes Cardiovascular: Denies palpitation, chest discomfort or lower extremity swelling Gastrointestinal: Positive for constipation.  Denies any nausea or vomiting. Skin: Denies abnormal skin rashes Lymphatics: Denies new lymphadenopathy or easy bruising.  Complains of lower extremity swelling of recent onset. Neurological:Denies numbness, tingling or new weaknesses Behavioral/Psych: Mood is stable, no new changes  All other systems were reviewed with the patient and are negative.  PHYSICAL EXAMINATION: ECOG PERFORMANCE STATUS: 2 - Symptomatic, <50% confined to  bed  Vitals:   02/06/18 1311  BP: (!) 131/59  Pulse: 64  Resp: 18  SpO2: 100%   Filed Weights   02/06/18 1311  Weight: 85 lb (38.6 kg)    GENERAL: She is in no distress.  She is cachectic looking. SKIN: skin color, texture, turgor are normal, no rashes or significant lesions EYES: normal, conjunctiva are pink and non-injected, sclera clear OROPHARYNX:no exudate, no erythema and lips, buccal mucosa, and tongue normal  NECK: supple, thyroid normal size, non-tender, without nodularity LYMPH:  no palpable lymphadenopathy in the cervical, axillary or inguinal LUNGS: clear to auscultation and percussion with normal breathing effort.  Decreased breath sounds at bases. HEART: regular rate & rhythm and no murmurs.  1+ edema bilaterally. ABDOMEN: Soft, nontender.  Ascites present.  No palpable masses.  Musculoskeletal:no cyanosis of digits and no clubbing  PSYCH: alert & oriented x 3 with fluent speech   LABORATORY DATA:  I have reviewed the data as listed Lab Results  Component Value Date   WBC 12.0 (H) 01/28/2018   HGB 11.5 (L) 01/28/2018   HCT 35.9 (L) 01/28/2018   MCV 87.1 01/28/2018   PLT 611 (H) 01/28/2018     Chemistry      Component Value Date/Time   NA 135 01/29/2018 0419   K 4.0 01/29/2018 0419   CL 105 01/29/2018 0419   CO2 25 01/29/2018 0419   BUN 13 01/29/2018 0419   CREATININE  0.58 01/29/2018 0419      Component Value Date/Time   CALCIUM 8.0 (L) 01/29/2018 0419   ALKPHOS 77 01/28/2018 0408   AST 20 01/28/2018 0408   ALT 10 01/28/2018 0408   BILITOT 0.3 01/28/2018 0408       RADIOGRAPHIC STUDIES: I have personally reviewed the radiological images as listed and agreed with the findings in the report. Ct Abdomen Pelvis W Contrast  Result Date: 01/26/2018 CLINICAL DATA:  20 pound weight loss over the last month. Central abdominal pain and digestion. EXAM: CT ABDOMEN AND PELVIS WITH CONTRAST TECHNIQUE: Multidetector CT imaging of the abdomen and pelvis was performed using the standard protocol following bolus administration of intravenous contrast. CONTRAST:  184m ISOVUE-300 IOPAMIDOL (ISOVUE-300) INJECTION 61% COMPARISON:  None. FINDINGS: Lower chest: Small to moderate-sized hiatal hernia.  Cachexia. Hepatobiliary: 4 mm hypodense lesion in the lateral segment left hepatic lobe on image 13/2. 6 mm hypodense lesion in the right hepatic lobe on image 18/2. Probable tiny gallstone in the gallbladder, 2 mm in diameter on image 26/2. Borderline gallbladder wall thickening. Mild periportal edema.  No biliary dilatation. Pancreas: Upper normal caliber of the dorsal pancreatic duct. Otherwise unremarkable. Spleen: Unremarkable Adrenals/Urinary Tract: Adrenal glands unremarkable. 7 mm hypodense lesion of the right mid kidney, likely a benign cyst but technically too small to characterize. No appreciable hydronephrosis or urinary tract calculus. Stomach/Bowel: The bowel is suspended in prominent ascites. Prominent wall thickening in the sigmoid colon, tumor not excluded. Vascular/Lymphatic: Aortoiliac atherosclerotic vascular disease. Reproductive: Ill definition of the uterus, query hysterectomy. Ovaries indistinct. Other: Prominent ascites. Enhancing and some calcified margins of the pelvic portion of the ascites posteriorly for example on images 55-61 of series 2. Suspected tumor  deposition along the left upper quadrant omentum. Probable thick tumor throughout the transverse colon and splenic flexure mesentery. Musculoskeletal: Dextroconvex lumbar scoliosis. Lumbar spondylosis and degenerative disc disease including a notable disc protrusion at the L3-4 level. Degenerative arthropathy of both hips. IMPRESSION: 1. Prominent ascites with enhancing margins especially posteriorly in the pelvis,  and suspected omental and mesenteric caking of tumor in the left upper quadrant and along the transverse colon mesentery. Appearance highly suspicious for malignancy, common etiologies would be from mucinous colon cancer or ovarian cancer. The ovaries are poorly visualized. Peritonitis is a less likely differential diagnostic consideration given the overall appearance. Correlate with serologic tumor markers. 2. Marked wall thickening in the sigmoid colon could be from diverticulosis or tumor. 3. Cachexia. 4. Other imaging findings of potential clinical significance: Small to moderate-sized hiatal hernia. Possible gallstone. Several tiny hepatic lesions are technically nonspecific. Mild periportal edema. Aortic Atherosclerosis (ICD10-I70.0). Dextroconvex lumbar scoliosis. Lumbar spondylosis and degenerative disc disease. Electronically Signed   By: Van Clines M.D.   On: 01/26/2018 21:38   US Paracentesis  Result Date: 01/28/2018 INDICATION: Peritoneal carcinomatosis by CT EXAM: ULTRASOUND GUIDED DIAGNOSTIC AND THERAPEUTIC PARACENTESIS MEDICATIONS: None. COMPLICATIONS: None immediate. PROCEDURE: Procedure, benefits, and risks of procedure were discussed with patient. Written informed consent for procedure was obtained. Time out protocol followed. Adequate collection of ascites localized by ultrasound in RIGHT lower quadrant. Skin prepped and draped in usual sterile fashion. Skin and soft tissues anesthetized with 10 mL of 1% lidocaine. 5 [place] Yueh catheter placed into peritoneal cavity. 2.2 L  of yellow ascitic fluid aspirated by vacuum bottle suction. Procedure tolerated well by patient without immediate complication. FINDINGS: A total of approximately 2.2 L of ascitic fluid was removed. Samples were sent to the laboratory as requested by the clinical team. IMPRESSION: Successful ultrasound-guided paracentesis yielding 2.2 liters of peritoneal fluid. Electronically Signed   By: Lavonia Dana M.D.   On: 01/28/2018 11:52   [place] Ascites (abdomen Limited)  Result Date: 02/02/2018 CLINICAL DATA:  Malignant ascites. Paracentesis 5 days ago with continued full sensation, evaluate for therapeutic paracentesis. EXAM: LIMITED ABDOMEN ULTRASOUND FOR ASCITES TECHNIQUE: Limited ultrasound survey for ascites was performed in all four abdominal quadrants. COMPARISON:  01/28/2018 FINDINGS: Overall small ascites, decreased from prior. When the fluid was accumulated into the right abdomen it measures 3 cm in depth from the liver tip to the right lower quadrant. 1 L or less is estimated. Peritoneal nodularity is seen, consistent with prior CT findings and history of peritoneal carcinomatosis. The findings and images were shared with the patient. I am uncertain how much of the patient's symptoms are related to fluid volume and she decided that the benefits of drainage were less than the procedural risks. IMPRESSION: Small volume ascites, estimated at 1 L or less. Relationship to symptoms is uncertain and the patient deferred paracentesis at this time. Electronically Signed   By: Monte Fantasia M.D.   On: 02/02/2018 10:34   US Abdomen Limited Ruq  Result Date: 01/27/2018 CLINICAL DATA:  Pancreatitis, 20 pounds weight loss EXAM: ULTRASOUND ABDOMEN LIMITED RIGHT UPPER QUADRANT COMPARISON:  CT abdomen and pelvis 01/26/2018 FINDINGS: Gallbladder: Upper normal gallbladder wall thickness. Tiny probable gallbladder polyp 3 mm diameter. No definite shadowing calculi, wall thickening, or sonographic Murphy sign. Common bile  duct: Diameter: 4 mm diameter, normal Liver: Normal echogenicity. No definite hepatic mass or nodularity. Portal vein is patent on color Doppler imaging with normal direction of blood flow towards the liver. Significant ascites. Noted RIGHT pleural effusion. IMPRESSION: Significant ascites and small RIGHT pleural effusion. Tiny gallbladder polyp 3 mm diameter without definite gallstones or biliary dilatation. Electronically Signed   By: Lavonia Dana M.D.   On: 01/27/2018 10:28    ASSESSMENT & PLAN:  Peritoneal carcinomatosis ([place]) 1.  Peritoneal carcinomatosis: - Admitted to the  hospital from 01/26/2018 through 02/02/2018 with severe weakness, abdominal distention and 20 pound weight loss over the last 3 months.  Quit smoking 2 weeks ago, smoked 1 pack/day for 50 years. - CT scan of the abdomen and pelvis with contrast dated 01/26/2018 shows prominent ascites with enhancing margins especially posteriorly in the pelvis, suspected omental and mesenteric caking of tumor in the left upper quadrant and along the transverse colon mesentery suspicious for mucinous colon cancer versus ovarian cancer.  There was marked thickening of the sigmoid colon could be from diverticulosis or tumor. -EGD on 01/28/2018 was within normal limits. -Ultrasound paracentesis on 01/28/2018 done with 2.2 L of fluid removed with cytology showing malignant cells consistent with metastatic adenocarcinoma.  This was positive for CK7 and p53.  Negative for TTF-1, CK 20 and CDX 2.  Overall Immuno profile points towards a gynecologic primary. - Patient currently at a rehab facility in [place].  She reports improvement in her weakness slightly. - Will order CT scan of the chest with contrast for staging.  Patient will need a biopsy to confirm her diagnosis.  I will ask interventional radiology to see if they can biopsy the peritoneum.  I will see her back after the biopsy.  2.  Malignant ascites: -She is currently on Lasix 20 mg daily  and spironolactone 25 mg daily.  This can be titrated as needed.  3.  Family history: -Sister had metastatic breast cancer.  Another sister had cancer with lymph node in the neck removed.  Patient does not know the type of cancer.  Niece reportedly had colon cancer.  If ovarian cancer is confirmed, she will undergo both somatic and germline mutation testing.  Orders Placed This Encounter  Procedures  . CT Chest W Contrast    Standing Status:   Future    Standing Expiration Date:   02/06/2019    Order Specific Question:   ** REASON FOR EXAM (FREE TEXT)    Answer:   newly diagnosed adenocarcinoma in the peritoneaum    Order Specific Question:   If indicated for the ordered procedure, I authorize the administration of contrast media per Radiology protocol    Answer:   Yes    Order Specific Question:   Preferred imaging location?    Answer:   [facility]    Order Specific Question:   Radiology Contrast Protocol - do NOT remove file path    Answer:   [email or web address]  . US BIOPSY (ABDOMINAL RETROPERTIONEAL MASS)    Standing Status:   Future    Standing Expiration Date:   04/10/2019    Order Specific Question:   Lab orders requested (DO NOT place separate lab orders, these will be automatically ordered during procedure specimen collection):    Answer:   Surgical Pathology    Order Specific Question:   Reason for Exam (SYMPTOM  OR DIAGNOSIS REQUIRED)    Answer:   peritoneal carcinomatosis    Order Specific Question:   Preferred imaging location?    Answer:   [facility]  . Ambulatory referral to Social Work    Referral Priority:   Routine    Referral Type:   Consultation    Referral Reason:   Specialty Services Required    Number of Visits Requested:   1  . Ambulatory referral to Social Work    Referral Priority:   Routine    Referral Type:   Consultation    Referral Reason:   Specialty Services Required  Number of Visits Requested:   1     All questions were answered. The patient knows to call the clinic with any problems, questions or concerns.      Derek Jack, MD 02/06/2018 4:17 PM

## 2018-02-10 ENCOUNTER — Encounter (HOSPITAL_COMMUNITY): Payer: Self-pay | Admitting: General Practice

## 2018-02-10 NOTE — Progress Notes (Signed)
[place] Psychosocial Distress Screening Clinical Social Work  Clinical Social Work was referred by distress screening protocol.  The patient scored a 5 on the Psychosocial Distress Thermometer which indicates moderate distress. Clinical Social Worker reviewed screen, noted that concerns were primarily medical.  As patient is currently residing in rehab facility and spouse has dementia per chart/patient is caregiver, CSW will not contact at this time.  Please consult CSW directly w needs/concerns, CSW available to assist as needed.     ONCBCN DISTRESS SCREENING 02/06/2018  Screening Type Initial Screening  Distress experienced in past week (1-10) 5  Practical problem type (No Data)  Emotional problem type Nervousness/Anxiety;Adjusting to illness  Information Concerns Type Lack of info about diagnosis;Lack of info about treatment  Physical Problem type Pain;Constipation/diarrhea  Physician notified of physical symptoms Yes  Referral to clinical psychology No  Referral to clinical social work Yes  Referral to dietition Yes  Referral to financial advocate No  Referral to support programs No  Referral to palliative care No    Clinical Social Worker follow up needed: No. not at this time, can be reconsulted in future as needed.    If yes, follow up plan:  Beverely Pace, [place], LCSW Clinical Social Worker Phone:  [phone]

## 2018-02-24 ENCOUNTER — Other Ambulatory Visit (HOSPITAL_COMMUNITY): Payer: Self-pay | Admitting: *Deleted

## 2018-02-24 ENCOUNTER — Other Ambulatory Visit (HOSPITAL_COMMUNITY): Payer: Self-pay | Admitting: Nurse Practitioner

## 2018-02-24 DIAGNOSIS — C786 Secondary malignant neoplasm of retroperitoneum and peritoneum: Secondary | ICD-10-CM

## 2018-02-24 DIAGNOSIS — C801 Malignant (primary) neoplasm, unspecified: Principal | ICD-10-CM

## 2018-02-25 ENCOUNTER — Other Ambulatory Visit (HOSPITAL_COMMUNITY): Payer: Self-pay | Admitting: *Deleted

## 2018-02-25 ENCOUNTER — Ambulatory Visit (HOSPITAL_COMMUNITY)
Admission: RE | Admit: 2018-02-25 | Discharge: 2018-02-25 | Disposition: A | Discharge status: Home / Self Care | Payer: Medicare Other | Source: Ambulatory Visit | Attending: Hematology | Admitting: Hematology

## 2018-02-25 DIAGNOSIS — I700 Atherosclerosis of aorta: Secondary | ICD-10-CM | POA: Diagnosis not present

## 2018-02-25 DIAGNOSIS — J439 Emphysema, unspecified: Secondary | ICD-10-CM | POA: Insufficient documentation

## 2018-02-25 DIAGNOSIS — C801 Malignant (primary) neoplasm, unspecified: Secondary | ICD-10-CM | POA: Diagnosis present

## 2018-02-25 DIAGNOSIS — C786 Secondary malignant neoplasm of retroperitoneum and peritoneum: Secondary | ICD-10-CM | POA: Diagnosis present

## 2018-02-25 DIAGNOSIS — I2510 Atherosclerotic heart disease of native coronary artery without angina pectoris: Secondary | ICD-10-CM | POA: Insufficient documentation

## 2018-02-25 DIAGNOSIS — J90 Pleural effusion, not elsewhere classified: Secondary | ICD-10-CM | POA: Diagnosis not present

## 2018-02-25 MED ORDER — IOHEXOL 300 MG/ML  SOLN
75.0000 mL | Freq: Once | INTRAMUSCULAR | Status: AC | PRN
  Administered 2018-02-25: 75 mL via INTRAVENOUS

## 2018-02-25 NOTE — Progress Notes (Signed)
Orders placed for cytology on fluid collected during paracentesis next week.

## 2018-02-26 ENCOUNTER — Ambulatory Visit (HOSPITAL_COMMUNITY): Payer: TRICARE For Life (TFL)

## 2018-02-26 ENCOUNTER — Ambulatory Visit (HOSPITAL_COMMUNITY): Payer: TRICARE For Life (TFL) | Admitting: Hematology

## 2018-02-27 ENCOUNTER — Encounter (HOSPITAL_COMMUNITY): Payer: TRICARE For Life (TFL)

## 2018-02-27 ENCOUNTER — Ambulatory Visit (HOSPITAL_COMMUNITY): Payer: TRICARE For Life (TFL) | Admitting: Hematology

## 2018-03-06 ENCOUNTER — Ambulatory Visit (HOSPITAL_COMMUNITY)
Admission: RE | Admit: 2018-03-06 | Discharge: 2018-03-06 | Disposition: A | Discharge status: Home / Self Care | Payer: Medicare Other | Source: Ambulatory Visit | Attending: Nurse Practitioner | Admitting: Nurse Practitioner

## 2018-03-06 DIAGNOSIS — C801 Malignant (primary) neoplasm, unspecified: Secondary | ICD-10-CM | POA: Diagnosis not present

## 2018-03-06 DIAGNOSIS — C786 Secondary malignant neoplasm of retroperitoneum and peritoneum: Secondary | ICD-10-CM | POA: Insufficient documentation

## 2018-03-06 DIAGNOSIS — R180 Malignant ascites: Secondary | ICD-10-CM | POA: Insufficient documentation

## 2018-03-06 NOTE — Procedures (Signed)
PreOperative Dx: Malignant ascites Postoperative Dx: Malignant ascites Procedure:   US guided paracentesis Radiologist:  Thornton Papas Anesthesia:  10 ml of1% lidocaine Specimen:  2.5 L of yellow ascitic fluid EBL:   < 1 ml Complications: None

## 2018-03-06 NOTE — Progress Notes (Addendum)
Paracentesis complete no signs of distress.  

## 2018-03-10 ENCOUNTER — Inpatient Hospital Stay (HOSPITAL_COMMUNITY): Payer: Medicare Other | Admitting: Dietician

## 2018-03-10 ENCOUNTER — Other Ambulatory Visit: Payer: Self-pay

## 2018-03-10 ENCOUNTER — Inpatient Hospital Stay (HOSPITAL_COMMUNITY): Payer: Medicare Other | Attending: Hematology | Admitting: Hematology

## 2018-03-10 ENCOUNTER — Encounter (HOSPITAL_COMMUNITY): Payer: Self-pay | Admitting: Hematology

## 2018-03-10 VITALS — Wt 70.5 lb

## 2018-03-10 DIAGNOSIS — Z5111 Encounter for antineoplastic chemotherapy: Secondary | ICD-10-CM | POA: Diagnosis present

## 2018-03-10 DIAGNOSIS — Z87891 Personal history of nicotine dependence: Secondary | ICD-10-CM | POA: Insufficient documentation

## 2018-03-10 DIAGNOSIS — Z8500 Personal history of malignant neoplasm of unspecified digestive organ: Secondary | ICD-10-CM | POA: Diagnosis not present

## 2018-03-10 DIAGNOSIS — Z79899 Other long term (current) drug therapy: Secondary | ICD-10-CM | POA: Diagnosis not present

## 2018-03-10 DIAGNOSIS — Z853 Personal history of malignant neoplasm of breast: Secondary | ICD-10-CM | POA: Insufficient documentation

## 2018-03-10 DIAGNOSIS — R180 Malignant ascites: Secondary | ICD-10-CM | POA: Insufficient documentation

## 2018-03-10 DIAGNOSIS — C786 Secondary malignant neoplasm of retroperitoneum and peritoneum: Secondary | ICD-10-CM

## 2018-03-10 DIAGNOSIS — C801 Malignant (primary) neoplasm, unspecified: Secondary | ICD-10-CM | POA: Insufficient documentation

## 2018-03-10 NOTE — Patient Instructions (Signed)
[facility] Discharge Instructions     Thank you for choosing [facility] at [facility] to provide your oncology and hematology care.  To afford each patient quality time with our provider, please arrive at least 15 minutes before your scheduled appointment time.   If you have a lab appointment with the Cancer Center please come in thru the  Main Entrance and check in at the main information desk  You need to re-schedule your appointment should you arrive 10 or more minutes late.  We strive to give you quality time with our providers, and arriving late affects you and other patients whose appointments are after yours.  Also, if you no show three or more times for appointments you may be dismissed from the clinic at the providers discretion.     Again, thank you for choosing [facility].  Our hope is that these requests will decrease the amount of time that you wait before being seen by our physicians.       _____________________________________________________________  Should you have questions after your visit to  Cancer Center, please contact our office at [phone] between the hours of 8:00 a.m. and 4:30 p.m.  Voicemails left after 4:00 p.m. will not be returned until the following business day.  For prescription refill requests, have your pharmacy contact our office and allow 72 hours.    Cancer Center Support Programs:   > Cancer Support Group  2nd Tuesday of the month 1pm-2pm, Journey Room    

## 2018-03-10 NOTE — Assessment & Plan Note (Signed)
1.  Peritoneal carcinomatosis: - Admitted to the hospital from 01/26/2018 through 02/02/2018 with severe weakness, abdominal distention and 20 pound weight loss over the last 3 months.  Quit smoking 2 weeks ago, smoked 1 pack/day for 50 years. - CT scan of the abdomen and pelvis with contrast dated 01/26/2018 shows prominent ascites with enhancing margins especially posteriorly in the pelvis, suspected omental and mesenteric caking of tumor in the left upper quadrant and along the transverse colon mesentery suspicious for mucinous colon cancer versus ovarian cancer.  There was marked thickening of the sigmoid colon could be from diverticulosis or tumor. -EGD on 01/28/2018 was within normal limits. -Ultrasound paracentesis on 01/28/2018 done with 2.2 L of fluid removed with cytology showing malignant cells consistent with metastatic adenocarcinoma.  This was positive for CK7 and p53.  Negative for TTF-1, CK 20 and CDX 2.  Overall Immuno profile points towards a gynecologic primary. - Patient currently at a rehab facility in [place].  CA 125 elevated at 91.  CEA was 6.2. -I have discussed with the interventional radiologist, who felt that peritoneal biopsy is risky in her. - She had a paracentesis done on 03/06/2018 and 2.5 L was taken out.  Pathology was reported as adenocarcinoma.  I have called and talked to Dr. Claudette Laws, who is the pathologist, who felt that there is enough cellblock to send for foundation 1 testing. -I discussed the results of the CT of the chest dated 02/25/2018 which did not show any evidence of metastatic disease. - We had a prolonged discussion about best supportive care in the form of hospice versus treatment with low-dose chemotherapy.  Obviously she is not a candidate for full dose carboplatin and paclitaxel.  Upon hearing pros and cons of both options, she wanted to try chemotherapy.  If she does not tolerate it well, she will quit.  I have recommended starting out with  carboplatin AUC 2 weekly with the intention of adding paclitaxel 40 to 50 mg/m.  This will be continued for 18 weeks.  We talked about the side effects in detail.  She agrees and gives [place] permission to proceed with the treatment.  I plan to schedule it later this week. -We will see her back in 1 week to see how she is tolerating treatment.  2.  Malignant ascites: -I have reviewed her nursing home notes.  She is currently on Lasix 20 mg and spironolactone 25 mg daily.  3.  Family history: -Sister had metastatic breast cancer.  Another sister had cancer with lymph node in the neck removed.  Patient does not know the type of cancer.  Niece reportedly had colon cancer.  I would make a referral to our geneticist for BRCA1/2 testing.

## 2018-03-10 NOTE — Progress Notes (Signed)
Nutrition Assessment  Reason for Assessment: No chief complaint on file.  ASSESSMENT:  82 y/o female PMHx Chronic back pain, COPD, DDD, Depression. 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 Following up at [facility] following discharge.   Patient seen with daughter today. RD had seen patient last month in hospital and she is noticeably more frail/emaciated than she was prior. Pt is HOH and somewhat confused. Majority of information obtain from daughter.   Daughter notes patient has been at a rehab facility and will likely be there for 2 more weeks. The patient has been eating poorly. She is eating only "bites" at meals. Daughter believes poor intake is multifactorial. She notes pt recently lost her husband which has been very stressful. Additionally, the patient has been restricted in what she can eat. Patient routinely used salt with her food, but now She is on a HH/Low salt diet at SNF due to her ascites. (Last paracentesis was 8/23 which resulted in removal of 2.5 Liters fluid.) Additionally,  reports dysgeusia; patient will take a few bites but quickly lose interest in eating due poor taste. Finally, the daughter believes the patient "forgets" to eat a lot,  Has general anorexia. She notes no one reminds her or cues her to eat.   Unfortunately, patient also has not been regularly receiving oral supplements at the facility. She is not receiving any vitamins.   GI wise, she has cyclic diarrhea and constipation. Daughter says pt is constipated at baseline. Staff will give her a softener or laxative, and she will subsequently have diarrhea for a day or so. The cycle then repeats. She has numerous bowel medications ordered.    She denies any nausea or vomiting. She does have some mild dysphagia. She had esophageal dilation during admission. She just needs to taker her time and be careful of bite  size.   Her abdomen is distended. Mild amount of ascites present. Patient has poor insight into her disease process, stating "so when is this fluid going to go away".   Weight is 70.5 lbs, a loss of >15% of her BW in ~1 month  Wt Readings from Last 10 Encounters:  03/10/18 70 lb 8 oz (32 kg)  02/06/18 85 lb (38.6 kg)  02/02/18 87 lb 4.8 oz (39.6 kg)   MEDICATIONS:  Supportive meds: Remeron, Cranberry-VitC, Lasix, Spironolactone, Senokot S, ppi, Zofran, Linzess, Oxycodone,   LABS:  No recent labs.   ANTHROPOMETRICS: Height:  Ht Readings from Last 1 Encounters:  02/06/18 5\' 1"  (1.549 m)   Weight:  Wt Readings from Last 1 Encounters:  03/10/18 70 lb 8 oz (32 kg)   BMI:  BMI Readings from Last 1 Encounters:  03/10/18 13.32 kg/m   UBW: No data recorded IBW: 50 kg    ESTIMATED ENERGY NEEDS:  Kcal: >1450 kcals (45 kcal/kg bw) Protein: >70g/kg bw (2.2g/kg bw) Fluid:>1.5 L fluid (5ml/kcal)   NUTRITION - FOCUSED PHYSICAL EXAM: Nutrition Assessment and Review - 03/10/18 1048      Subcutaneous Fat   Orbital Region  Moderate depletion    Upper Arm Region  Severe depletion    Thoracic and Lumbar Region  Severe depletion    Buccal Region  Severe depletion      Muscle   Temple Region  Severe depletion    Clavicle Bone Region  Severe depletion    Clavicle and Acromion Bone Region  Severe depletion    Scapular  Bone Region  Severe depletion    Dorsal Hand  Severe depletion    Patellar Region  Severe depletion    Anterior Thigh Region  Severe depletion    Posterior Calf Region  Severe depletion      Edema RD   Edema (RD Assessment)  Mild      Micronutrient Assessment   Hair  Reviewed    Eyes  Reviewed    Mouth  Reviewed    Skin  Reviewed    Nails  Reviewed      NUTRITION DIAGNOSIS:  Severe malnutrition related to cancer, taste changes, poor appetite as evidenced by complete loss of muscle/fat stores and loss of >15% in 1 month  DOCUMENTATION CODES:   Underweight, Severe malnutrition in context of chronic illness  INTERVENTION:  Spent majority of encounter educating patient on appropriate oncologic nutrition therapy. She is not eating well for numerous reasons. RD first reviewed taste changes and poor appetite are not likely to go away as these are related to her cancer. She may need to think of food as medicine. Reviewed that if she is too weak/frail, she will not be able to receive any treatment as it potentially could cause death.   RD notes she should not be on any diet restrictions, highly doubt the amount of salt she would eat would have a negative impact. RD Educated malnutrition will cause her to experience increased swelling as well.   Rd reviewed list of high kcal/protein containing foods. Provided list titled "Increasing calories and Protein"   RD reviewed eating behaviors to promote weight gain such as small frequent meals, drinking calorie containing beverages, making the most of each bite by adding high kcal condiments to items, etc  She also should be regularly consuming high kcal supplements. Patient notes she really likes the [facility]. RD reviewed the nutrition information of Breeze vs Ensure enlive. She  agreed to drinking the [facility]. RD presents pt/daughter with Case of Ensure Enlive today.   RD reviewed tips/guidelines for combating taste changes. Provided handout titled "Taste and Smell changes".  Regarding patient "forgetting" to eat. Daughter noted she would leave a sign in patients room at SNF reminding the patient to [place]. RD thought this was an excellent idea.   RD wrote several goal on top of handout: 1. Drink 3 Ensure supplements/day 2. Eat small meals throughout day 3. Eat protein source at each meal 4. Add condiments/additives to foods/bevs to increase caloric content.   GOAL:  Oral intake to meet >100% of needs, wt gain  VS.  Comfort Care/Hospice  MONITOR:  PO intake, Supplement acceptance,  Diet advancement, Labs, Weight trends, Goals of Care, treatment plan  Next Visit:  Will monitor patient schedule and add appointment as appropriate.   Burtis Junes RD, LDN, CNSC Clinical Nutrition Available Tues-Sat via Pager: [phone] 03/10/2018 10:51 AM

## 2018-03-10 NOTE — Progress Notes (Signed)
[facility] [address], [place] [address]   CLINIC:  Medical Oncology/Hematology  PCP:  Quentin Cornwall, MD [address], [place] [address] [phone]   REASON FOR VISIT:  Follow-up for Malignant ascites with peritoneal carcinomatosis  CURRENT THERAPY: Carboplatin weekly   INTERVAL HISTORY:  Christy Valdez 82 y.o. female returns for routine follow-up for malignant ascites with peritoneal carcinomatosis. Patient is here today with family. Patient has lost 15 pounds since her last visit. Patients appetite is decreased. She reports nothing taste good. She got a box of boost nutritional supplement drinks and she will start drinking 3 a day. Her energy level is decreased and is not active at the nursing home. Her only activity is to the bathroom and back. PT/OT works with her occasionally but she is unable to do much. Patient denies any new pain. Denies vomiting.     REVIEW OF SYSTEMS:  Review of Systems  Constitutional: Positive for chills and fatigue.  HENT:   Positive for trouble swallowing.   Gastrointestinal: Positive for constipation.  Hematological: Bruises/bleeds easily.  All other systems reviewed and are negative.    PAST MEDICAL/SURGICAL HISTORY:  Past Medical History:  Diagnosis Date  . Chronic back pain   . COPD (chronic obstructive pulmonary disease) ([place])   . DDD (degenerative disc disease), lumbar   . Depression   . Scoliosis    Past Surgical History:  Procedure Laterality Date  . [place], anterior approach  . COLONOSCOPY WITH ESOPHAGOGASTRODUODENOSCOPY (EGD) AND ESOPHAGEAL DILATION (ED)     remote past, ?2005  . ESOPHAGOGASTRODUODENOSCOPY N/A 01/28/2018   Procedure: ESOPHAGOGASTRODUODENOSCOPY (EGD);  Surgeon: Daneil Dolin, MD;  Location: [facility];  Service: Endoscopy;  Laterality: N/A;  with possible dilation      SOCIAL HISTORY:  Social History   Socioeconomic History  . Marital  status: Married    Spouse name: Not on file  . Number of children: Not on file  . Years of education: Not on file  . Highest education level: Not on file  Occupational History  . Not on file  Social Needs  . Financial resource strain: Not on file  . Food insecurity:    Worry: Not on file    Inability: Not on file  . Transportation needs:    Medical: Not on file    Non-medical: Not on file  Tobacco Use  . Smoking status: Current Every Day Smoker    Packs/day: 1.00    Types: Cigarettes  . Smokeless tobacco: Never Used  Substance and Sexual Activity  . Alcohol use: Never    Frequency: Never  . Drug use: Never  . Sexual activity: Not on file  Lifestyle  . Physical activity:    Days per week: Not on file    Minutes per session: Not on file  . Stress: Not on file  Relationships  . Social connections:    Talks on phone: Not on file    Gets together: Not on file    Attends religious service: Not on file    Active member of club or organization: Not on file    Attends meetings of clubs or organizations: Not on file    Relationship status: Not on file  . Intimate partner violence:    Fear of current or ex partner: Not on file    Emotionally abused: Not on file    Physically abused: Not on file  Forced sexual activity: Not on file  Other Topics Concern  . Not on file  Social History Narrative  . Not on file    FAMILY HISTORY:  Family History  Problem Relation Age of Onset  . AAA (abdominal aortic aneurysm) Mother   . CVA Father   . Cancer Sister        Unspecified type.  Marland Kitchen CAD Brother        Died of MI  . Breast cancer Sister   . CAD Brother   . Colon cancer Neg Hx     CURRENT MEDICATIONS:  Outpatient Encounter Medications as of 03/10/2018  Medication Sig Note  . albuterol (PROVENTIL) (2.5 MG/3ML) 0.083% nebulizer solution Take 2.5 mg by nebulization every 6 (six) hours as needed for wheezing or shortness of breath.    . carboxymethylcellulose (REFRESH TEARS)  0.5 % SOLN 1 drop 3 (three) times daily as needed.   . citalopram (CELEXA) 40 MG tablet Take 0.5 tablets (20 mg total) by mouth daily.   . Cranberry-Vitamin C-Inulin (UTI-STAT PO) Take by mouth.   . Fluticasone-Umeclidin-Vilant (TRELEGY ELLIPTA) 100-62.5-25 MCG/INH AEPB Inhale into the lungs.   . furosemide (LASIX) 20 MG tablet Take 1 tablet (20 mg total) by mouth daily.   Marland Kitchen linaclotide (LINZESS) 145 MCG CAPS capsule Take 1 capsule (145 mcg total) by mouth daily before breakfast.   . magnesium citrate SOLN Take 296 mLs (1 Bottle total) by mouth daily as needed for severe constipation.   . mirtazapine (REMERON) 30 MG tablet Take 30 mg by mouth at bedtime.   . ondansetron (ZOFRAN) 4 MG tablet Take 4 mg by mouth every 8 (eight) hours as needed for nausea or vomiting.   Marland Kitchen oxyCODONE-acetaminophen (PERCOCET/ROXICET) 5-325 MG tablet Take 1-2 tablets by mouth every 4 (four) hours as needed for moderate pain.   . pantoprazole (PROTONIX) 40 MG tablet Take 1 tablet (40 mg total) by mouth daily.   . polyethylene glycol (MIRALAX / GLYCOLAX) packet Take 17 g by mouth daily.   . pramipexole (MIRAPEX) 0.125 MG tablet Take 0.125 mg by mouth at bedtime.    . senna-docusate (SENOKOT-S) 8.6-50 MG tablet Take 2 tablets by mouth 2 (two) times daily.   Marland Kitchen SPIRIVA HANDIHALER 18 MCG inhalation capsule Place 18 mcg into inhaler and inhale daily.  01/26/2018: PATIENT HAS ON HAND BUT DOES NOT USE   . spironolactone (ALDACTONE) 25 MG tablet Take 0.5 tablets (12.5 mg total) by mouth daily.    No facility-administered encounter medications on file as of 03/10/2018.     ALLERGIES:  Allergies  Allergen Reactions  . Codeine Nausea And Vomiting     PHYSICAL EXAM:  ECOG Performance status: 2  VITAL SIGNS: BP: 130/78, P:87, R:20, TEMP:98.2, SAT:99%  Filed Weights   03/10/18 1004  Weight: 70 lb 8 oz (32 kg)    Physical Exam  Constitutional: She is oriented to person, place, and time. She has a sickly appearance.    Cardiovascular: Normal rate, regular rhythm and normal heart sounds.  Pulmonary/Chest: Effort normal and breath sounds normal.  Neurological: She is alert and oriented to person, place, and time.  Skin: Skin is warm and dry.     LABORATORY DATA:  I have reviewed the labs as listed.  CBC    Component Value Date/Time   WBC 12.0 (H) 01/28/2018 0408   RBC 4.12 01/28/2018 0408   HGB 11.5 (L) 01/28/2018 0408   HCT 35.9 (L) 01/28/2018 0408   PLT 611 (H) 01/28/2018  0408   MCV 87.1 01/28/2018 0408   MCH 27.9 01/28/2018 0408   MCHC 32.0 01/28/2018 0408   RDW 13.6 01/28/2018 0408   LYMPHSABS 0.6 (L) 01/27/2018 0413   MONOABS 0.1 01/27/2018 0413   EOSABS 0.0 01/27/2018 0413   BASOSABS 0.0 01/27/2018 0413   CMP Latest Ref Rng & Units 01/29/2018 01/28/2018 01/27/2018  Glucose 70 - 99 mg/dL 84 91 103(H)  BUN 8 - 23 mg/dL _0 Creatinine 0.44 - 1.00 mg/dL 0.58 0.59 0.58  Sodium 135 - 145 mmol/L 135 136 136  Potassium 3.5 - 5.1 mmol/L 4.0 3.9 4.7  Chloride 98 - 111 mmol/L 105 102 100  CO2 22 - 32 mmol/L _1 Calcium 8.9 - 10.3 mg/dL 8.0(L) 8.5(L) 8.4(L)  Total Protein 6.5 - 8.1 g/dL - 5.8(L) 5.8(L)  Total Bilirubin 0.3 - 1.2 mg/dL - 0.3 0.3  Alkaline Phos 38 - 126 U/L - 77 77  AST 15 - 41 U/L - 20 16  ALT 0 - 44 U/L - 10 10       DIAGNOSTIC IMAGING:  Independently reviewed CT scan of the chest dated 02/25/2018 images.  I have also reviewed and showed the images of CT scan of the abdomen and pelvis to the patient and her daughter.     ASSESSMENT & PLAN:   Peritoneal carcinomatosis ([place]) 1.  Peritoneal carcinomatosis: - Admitted to the hospital from 01/26/2018 through 02/02/2018 with severe weakness, abdominal distention and 20 pound weight loss over the last 3 months.  Quit smoking 2 weeks ago, smoked 1 pack/day for 50 years. - CT scan of the abdomen and pelvis with contrast dated 01/26/2018 shows prominent ascites with enhancing margins especially posteriorly in the  pelvis, suspected omental and mesenteric caking of tumor in the left upper quadrant and along the transverse colon mesentery suspicious for mucinous colon cancer versus ovarian cancer.  There was marked thickening of the sigmoid colon could be from diverticulosis or tumor. -EGD on 01/28/2018 was within normal limits. -Ultrasound paracentesis on 01/28/2018 done with 2.2 L of fluid removed with cytology showing malignant cells consistent with metastatic adenocarcinoma.  This was positive for CK7 and p53.  Negative for TTF-1, CK 20 and CDX 2.  Overall Immuno profile points towards a gynecologic primary. - Patient currently at a rehab facility in [place].  CA 125 elevated at 91.  CEA was 6.2. -I have discussed with the interventional radiologist, who felt that peritoneal biopsy is risky in her. - She had a paracentesis done on 03/06/2018 and 2.5 L was taken out.  Pathology was reported as adenocarcinoma.  I have called and talked to Dr. Claudette Laws, who is the pathologist, who felt that there is enough cellblock to send for foundation 1 testing. -I discussed the results of the CT of the chest dated 02/25/2018 which did not show any evidence of metastatic disease. - We had a prolonged discussion about best supportive care in the form of hospice versus treatment with low-dose chemotherapy.  Obviously she is not a candidate for full dose carboplatin and paclitaxel.  Upon hearing pros and cons of both options, she wanted to try chemotherapy.  If she does not tolerate it well, she will quit.  I have recommended starting out with carboplatin AUC 2 weekly with the intention of adding paclitaxel 40 to 50 mg/m.  This will be continued for 18 weeks.  We talked about the side effects in detail.  She agrees and gives [place] permission  to proceed with the treatment.  I plan to schedule it later this week. -We will see her back in 1 week to see how she is tolerating treatment.  2.  Malignant ascites: -I have reviewed  her nursing home notes.  She is currently on Lasix 20 mg and spironolactone 25 mg daily.  3.  Family history: -Sister had metastatic breast cancer.  Another sister had cancer with lymph node in the neck removed.  Patient does not know the type of cancer.  Niece reportedly had colon cancer.  I would make a referral to our geneticist for BRCA1/2 testing.   Total time spent is 40 minutes with more than 50% of the time spent face-to-face discussing scan results, diagnosis, treatment options and coordination of care.  Orders placed this encounter:  Orders Placed This Encounter  Procedures  . CBC with Differential/Platelet  . Comprehensive metabolic panel      Derek Jack, MD [place] [phone]

## 2018-03-10 NOTE — Progress Notes (Signed)
Chemotherapy teaching pulled together. 

## 2018-03-10 NOTE — Patient Instructions (Addendum)
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  We are going to treat you with palliative intent. This means that your cancer is treatable but not curable.  We are going to treat you with chemotherapy.  We will give you Carboplatin (paraplatin) and paclitaxel (taxol) through your port a cath or vein.  On week one you will be given Carboplatin only.  On your second and following weeks you will get both drugs. You will see the doctor regularly throughout treatment.  We monitor your lab work prior to every treatment. The doctor monitors your response to treatment by the way you are feeling, your blood work, and scans periodically.  There will be wait times while you are here for treatment.  It will take about 30 minutes to 1 hour for your lab work to result.  Then there will be wait times while pharmacy mixes your medications.   You will receive the following premedication prior to each treatment:  Aloxi -  High powered nausea medication given to reduce the risk of chemotherapy induced nausea  Dexamethasone - steroid - given to reduce the risk of you having an allergic reaction to the chemotherapy.  Dexamethasone can cause you to feel energized, nervous/anxious/jittery, make you have trouble sleeping, and/or make you feel hot/flushed in the face/neck and/or look pink/red in the face/neck. These side effects will pass as the medicine wears off Pepcid - antihistamine helps to prevent allergic reaction to chemotherapy Benadryl - antihistamine helps to prevent allergic reaction to chemotherapy   Carboplatin (Paraplatin, CBDCA)  About This Drug Carboplatin is used to treat cancer. It is given in the vein (IV).  Possible Side Effects . Bone marrow suppression. This is a decrease in the number of white blood cells, red blood cells, and platelets. This may raise your risk of infection, make you tired and weak (fatigue), and raise your  risk of bleeding. . Nausea and vomiting (throwing up) . Weakness . Changes in your liver function . Changes in your kidney function . Electrolyte changes . Pain  Note: Each of the side effects above was reported in 20% or greater of patients treated with carboplatin. Not all possible side effects are included above.  Warnings and Precautions . Severe bone marrow suppression . Allergic reactions, including anaphylaxis are rare but may happen in some patients. Signs of allergic reaction to this drug may be swelling of the face, feeling like your tongue or throat are swelling, trouble breathing, rash, itching, fever, chills, feeling dizzy, and/or feeling that your heart is beating in a fast or not normal way. If this happens, do not take another dose of this drug. You should get urgent medical treatment. . Severe nausea and vomiting . Effects on the nerves are called peripheral neuropathy. This risk is increased if you are over the age of 1 or if you have received other medicine with risk of peripheral neuropathy. You may feel numbness, tingling, or pain in your hands and feet. It may be hard for you to button your clothes, open jars, or walk as usual. The effect on the nerves may get worse with more doses of the drug. These effects get better in some people after the drug is stopped but it does not get better in all people. Marland Kitchen Blurred vision, loss of vision or other changes in eyesight . Decreased hearing . Skin and tissue irritation including redness, pain, warmth, or swelling at the IV site if the  drug leaks out of the vein and into nearby tissue. . Severe changes in your kidney function, which can cause kidney failure . Severe changes in your liver function, which can cause liver failure  Note: Some of the side effects above are very rare. If you have concerns and/or questions, please discuss them with your medical team.  Important Information . This drug may be present in the  saliva, tears, sweat, urine, stool, vomit, semen, and vaginal secretions. Talk to your doctor and/or your nurse about the necessary precautions to take during this time.  Treating Side Effects . Manage tiredness by pacing your activities for the day. . Be sure to include periods of rest between energy-draining activities. . To decrease the risk of infection, wash your hands regularly. . Avoid close contact with people who have a cold, the flu, or other infections. . Take your temperature as your doctor or nurse tells you, and whenever you feel like you may have a fever. . To help decrease the risk of bleeding, use a soft toothbrush. Check with your nurse before using dental floss. . Be very careful when using knives or tools. . Use an electric shaver instead of a razor. . Drink plenty of fluids (a minimum of eight glasses per day is recommended). . If you throw up or have loose bowel movements, you should drink more fluids so that you do not become dehydrated (lack of water in the body from losing too much fluid). . To help with nausea and vomiting, eat small, frequent meals instead of three large meals a day. Choose foods and drinks that are at room temperature. Ask your nurse or doctor about other helpful tips and medicine that is available to help stop or lessen these symptoms. . If you have numbness and tingling in your hands and feet, be careful when cooking, walking, and handling sharp objects and hot liquids. Marland Kitchen Keeping your pain under control is important to your well-being. Please tell your doctor or nurse if you are experiencing pain.  Food and Drug Interactions . There are no known interactions of carboplatin with food. . This drug may interact with other medicines. Tell your doctor and pharmacist about all the prescription and over-the-counter medicines and dietary supplements (vitamins, minerals, herbs and others) that you are taking at this time. Also, check with your  doctor or pharmacist before starting any new prescription or over-the-counter medicines, or dietary supplements to make sure that there are no interactions.  When to Call the Doctor Call your doctor or nurse if you have any of these symptoms and/or any new or unusual symptoms: . Fever of 100.4 F (38 C) or higher . Chills . Tiredness that interferes with your daily activities . Feeling dizzy or lightheaded . Easy bleeding or bruising . Nausea that stops you from eating or drinking and/or is not relieved by prescribed medicines . Throwing up more than 3 times a day . Blurred vision or other changes in eyesight . Decrease in hearing or ringing in the ear . Signs of allergic reaction: swelling of the face, feeling like your tongue or throat are swelling, trouble breathing, rash, itching, fever, chills, feeling dizzy, and/or feeling that your heart is beating in a fast or not normal way. If this happens, call 911 for emergency care. . While you are getting this drug, please tell your nurse right away if you have any pain, redness, or swelling at the site of the IV infusion . Signs of possible liver problems:  dark urine, pale bowel movements, bad stomach pain, feeling very tired and weak, unusual itching, or yellowing of the eyes or skin . Decreased urine, or very dark urine . Numbness, tingling, or pain in your hands and feet . Pain that does not go away or is not relieved by prescribed medicine . If you think you may be pregnant  Reproduction Warnings . Pregnancy warning: This drug may have harmful effects on the unborn baby. Women of child bearing potential should use effective methods of birth control during your cancer treatment. Let your doctor know right away if you think you may be pregnant. . Breastfeeding warning: It is not known if this drug passes into breast milk. For this reason, women should not breastfeed during treatment because this drug could enter the breast milk  and cause harm to a breastfeeding baby. . Fertility warning: Human fertility studies have not been done with this drug. Talk with your doctor or nurse if you plan to have children. Ask for information on sperm or egg banking.  Paclitaxel (Taxol)  Paclitaxel is a drug used to treat cancer. It is given in the vein (IV).  This will take 1 hour to infuse.  The first time this drug is given it will take longer to infuse because it is increased slowly to monitor for reactions.  The nurse will be in the room with you for the first 15 minutes.   Possible Side Effects . Hair loss. Hair loss is often temporary, although with certain medicine, hair loss can sometimes be permanent. Hair loss may happen suddenly or gradually. If you lose hair, you may lose it from your head, face, armpits, pubic area, chest, and/or legs. You may also notice your hair getting thin. . Swelling of your legs, ankles and/or feet (edema) . Flushing . Nausea and throwing up (vomiting) . Loose bowel movements (diarrhea) . Bone marrow depression. This is a decrease in the number of white blood cells, red blood cells, and platelets. This may raise your risk of infection, make you tired and weak (fatigue), and raise your risk of bleeding. . Effects on the nerves are called peripheral neuropathy. You may feel numbness, tingling, or pain in your hands and feet. It may be hard for you to button your clothes, open jars, or walk as usual. The effect on the nerves may get worse with more doses of the drug. These effects get better in some people after the drug is stopped but it does not get better in all people. . Changes in your liver function . Bone, joint and muscle pain . Abnormal EKG . Allergic reaction: Allergic reactions, including anaphylaxis are rare but may happen in some patients. Signs of allergic reaction to this drug may be swelling of the face, feeling like your tongue or throat are swelling, trouble breathing, rash, itching,  fever, chills, feeling dizzy, and/or feeling that your heart is beating in a fast or not normal way. If this happens, do not take another dose of this drug. You should get urgent medical treatment. . Infection . Changes in your kidney function.  Note: Each of the side effects above was reported in 20% or greater of patients treated with paclitaxel. Not all possible side effects are included above.  Warnings and Precautions . Severe bone marrow depression  Side Effects . To help with hair loss, wash with a mild shampoo and avoid washing your hair every day. . Avoid rubbing your scalp, instead, pat your hair or scalp dry .  Avoid coloring your hair . Limit your use of hair spray, electric curlers, blow dryers, and curling irons. . If you are interested in getting a wig, talk to your nurse. You can also call the [facility] at [phone] to find out information about the "Look Good, Feel Better" program close to where you live. It is a free program where women getting chemotherapy can learn about wigs, turbans and scarves as well as makeup techniques and skin and nail care. . Ask your doctor or nurse about medicines that are available to help stop or lessen diarrhea and/or nausea. . To help with nausea and vomiting, eat small, frequent meals instead of three large meals a day. Choose foods and drinks that are at room temperature. Ask your nurse or doctor about other helpful tips and medicine that is available to help or stop lessen these symptoms. . If you get diarrhea, eat low-fiber foods that are high in protein and calories and avoid foods that can irritate your digestive tracts or lead to cramping. Ask your nurse or doctor about medicine that can lessen or stop your diarrhea. . Mouth care is very important. Your mouth care should consist of routine, gentle cleaning of your teeth or dentures and rinsing your mouth with a mixture of 1/2 teaspoon of salt in 8 ounces of water or   teaspoon of baking soda in 8 ounces of water. This should be done at least after each meal and at bedtime. . If you have mouth sores, avoid mouthwash that has alcohol. Also avoid alcohol and smoking because they can bother your mouth and throat. . Drink plenty of fluids (a minimum of eight glasses per day is recommended). . Take your temperature as your doctor or nurse tells you, and whenever you feel like you may have a fever. . Talk to your doctor or nurse about precautions you can take to avoid infections and bleeding. . Be careful when cooking, walking, and handling sharp objects and hot liquids.  Food and Drug Interactions . There are no known interactions of paclitaxel with food. . This drug may interact with other medicines. Tell your doctor and pharmacist about all the medicines and dietary supplements (vitamins, minerals, herbs and others) that you are taking at this time. . The safety and use of dietary supplements and alternative diets are often not known. Using these might affect your cancer or interfere with your treatment. Until more is known, you should not use dietary supplements or alternative diets without your cancer doctor's help.  When to Call the Doctor Call your doctor or nurse if you have any of the following symptoms and/or any new or unusual symptoms: . Fever of 100.5 F (38 C) or above . Chills . Redness, pain, warmth, or swelling at the IV site during the infusion . Signs of allergic reaction: swelling of the face, feeling like your tongue or throat are swelling, trouble breathing, rash, itching, fever, chills, feeling dizzy, and/or feeling that your heart is beating in a fast or not normal way . Feeling that your heart is beating in a fast or not normal way (palpitations) . Weight gain of 5 pounds in one week (fluid retention) . Decreased urine or very dark urine . Signs of liver problems: dark urine, pale bowel movements, bad stomach pain, feeling very tired and  weak, unusual itching, or yellowing of the eyes or skin . Heavy menstrual period that lasts longer than normal . Easy bruising or bleeding . Nausea that stops  you from eating or drinking, and/or that is not relieved by prescribed medicines. . Loose bowel movements (diarrhea) more than 4 times a day or diarrhea with weakness or lightheadedness . Pain in your mouth or throat that makes it hard to eat or drink . Lasting loss of appetite or rapid weight loss of five pounds in a week . Signs of peripheral neuropathy: numbness, tingling, or decreased feeling in fingers or toes; trouble walking or changes in the way you walk; or feeling clumsy when buttoning clothes, opening jars, or other routine activities . Joint and muscle pain that is not relieved by prescribed medicines . Extreme fatigue that interferes with normal activities . While you are getting this drug, please tell your nurse right away if you have any pain, redness, or swelling at the site of the IV infusion. . If you think you are pregnant.  Reproduction Warnings . Pregnancy warning: This drug may have harmful effects on the unborn child, it is recommended that effective methods of birth control should be used during your cancer treatment. Let your doctor know right away if you think you may be pregnant. . Breast feeding warning: Women should not breast feed during treatment because this drug could enter the breast milk and cause harm to a breast feeding baby.   SELF CARE ACTIVITIES WHILE ON CHEMOTHERAPY:  Hydration Increase your fluid intake 48 hours prior to treatment and drink at least 8 to 12 cups (64 ounces) of water/decaffeinated beverages per day after treatment. You can still have your cup of coffee or soda but these beverages do not count as part of your 8 to 12 cups that you need to drink daily. No alcohol intake.  Medications Continue taking your normal prescription medication as prescribed.  If you start any new herbal or  new supplements please let us know first to make sure it is safe.  Mouth Care Have teeth cleaned professionally before starting treatment. Keep dentures and partial plates clean. Use soft toothbrush and do not use mouthwashes that contain alcohol. Biotene is a good mouthwash that is available at most pharmacies or may be ordered by calling [phone]. Use warm salt water gargles (1 teaspoon salt per 1 quart warm water) before and after meals and at bedtime. Or you may rinse with 2 tablespoons of three-percent hydrogen peroxide mixed in eight ounces of water. If you are still having problems with your mouth or sores in your mouth please call the clinic. If you need dental work, please let the doctor know before you go for your appointment so that we can coordinate the best possible time for you in regards to your chemo regimen. You need to also let your dentist know that you are actively taking chemo. We may need to do labs prior to your dental appointment.  Skin Care Always use sunscreen that has not expired and with SPF (Sun Protection Factor) of 50 or higher. Wear hats to protect your head from the sun. Remember to use sunscreen on your hands, ears, face, & feet.  Use good moisturizing lotions such as udder cream, eucerin, or even Vaseline. Some chemotherapies can cause dry skin, color changes in your skin and nails.    . Avoid long, hot showers or baths. . Use gentle, fragrance-free soaps and laundry detergent. . Use moisturizers, preferably creams or ointments rather than lotions because the thicker consistency is better at preventing skin dehydration. Apply the cream or ointment within 15 minutes of showering. Reapply moisturizer at night,  and moisturize your hands every time after you wash them.  Hair Loss (if your doctor says your hair will fall out)  . If your doctor says that your hair is likely to fall out, decide before you begin chemo whether you want to wear a wig. You may want to  shop before treatment to match your hair color. . Hats, turbans, and scarves can also camouflage hair loss, although some people prefer to leave their heads uncovered. If you go bare-headed outdoors, be sure to use sunscreen on your scalp. . Cut your hair short. It eases the inconvenience of shedding lots of hair, but it also can reduce the emotional impact of watching your hair fall out. . Don't perm or color your hair during chemotherapy. Those chemical treatments are already damaging to hair and can enhance hair loss. Once your chemo treatments are done and your hair has grown back, it's OK to resume dyeing or perming hair. With chemotherapy, hair loss is almost always temporary. But when it grows back, it may be a different color or texture. In older adults who still had hair color before chemotherapy, the new growth may be completely gray.  Often, new hair is very fine and soft.  Infection Prevention Please wash your hands for at least 30 seconds using warm soapy water. Handwashing is the #1 way to prevent the spread of germs. Stay away from sick people or people who are getting over a cold. If you develop respiratory systems such as green/yellow mucus production or productive cough or persistent cough let us know and we will see if you need an antibiotic. It is a good idea to keep a pair of gloves on when going into grocery stores/[employer] to decrease your risk of coming into contact with germs on the carts, etc. Carry alcohol hand gel with you at all times and use it frequently if out in public. If your temperature reaches 100.5 or higher please call the clinic and let us know.  If it is after hours or on the weekend please go to the ER if your temperature is over 100.5.  Please have your own personal thermometer at home to use.    Sex and bodily fluids If you are going to have sex, a condom must be used to protect the person that isn't taking chemotherapy. Chemo can decrease your libido (sex drive).  For a few days after chemotherapy, chemotherapy can be excreted through your bodily fluids.  When using the toilet please close the lid and flush the toilet twice.  Do this for a few day after you have had chemotherapy.   Effects of chemotherapy on your sex life Some changes are simple and won't last long. They won't affect your sex life permanently. Sometimes you may feel: . too tired . not strong enough to be very active . sick or sore  . not in the mood . anxious or low Your anxiety might not seem related to sex. For example, you may be worried about the cancer and how your treatment is going. Or you may be worried about money, or about how you family are coping with your illness. These things can cause stress, which can affect your interest in sex. It's important to talk to your partner about how you feel. Remember - the changes to your sex life don't usually last long. There's usually no medical reason to stop having sex during chemo. The drugs won't have any long term physical effects on your performance or  enjoyment of sex. Cancer can't be passed on to your partner during sex  Contraception It's important to use reliable contraception during treatment. Avoid getting pregnant while you or your partner are having chemotherapy. This is because the drugs may harm the baby. Sometimes chemotherapy drugs can leave a man or woman infertile.  This means you would not be able to have children in the future. You might want to talk to someone about permanent infertility. It can be very difficult to learn that you may no longer be able to have children. Some people find counselling helpful. There might be ways to preserve your fertility, although this is easier for men than for women. You may want to speak to a fertility expert. You can talk about sperm banking or harvesting your eggs. You can also ask about other fertility options, such as donor eggs. If you have or have had breast cancer, your doctor  might advise you not to take the contraceptive pill. This is because the hormones in it might affect the cancer.  It is not known for sure whether or not chemotherapy drugs can be passed on through semen or secretions from the vagina. Because of this some doctors advise people to use a barrier method if you have sex during treatment. This applies to vaginal, anal or oral sex. Generally, doctors advise a barrier method only for the time you are actually having the treatment and for about a week after your treatment. Advice like this can be worrying, but this does not mean that you have to avoid being intimate with your partner. You can still have close contact with your partner and continue to enjoy sex.  Animals If you have cats or birds we just ask that you not change the litter or change the cage.  Please have someone else do this for you while you are on chemotherapy.   Food Safety During and After Cancer Treatment Food safety is important for people both during and after cancer treatment. Cancer and cancer treatments, such as chemotherapy, radiation therapy, and stem cell/bone marrow transplantation, often weaken the immune system. This makes it harder for your body to protect itself from foodborne illness, also called food poisoning. Foodborne illness is caused by eating food that contains harmful bacteria, parasites, or viruses.  Foods to avoid Some foods have a higher risk of becoming tainted with bacteria. These include: Marland Kitchen Unwashed fresh fruit and vegetables, especially leafy vegetables that can hide dirt and other contaminants . Raw sprouts, such as alfalfa sprouts . Raw or undercooked beef, especially ground beef, or other raw or undercooked meat and poultry . Fatty, fried, or spicy foods immediately before or after treatment.  These can sit heavy on your stomach and make you feel nauseous. . Raw or undercooked shellfish, such as oysters. . Sushi and sashimi, which often contain raw fish.   . Unpasteurized beverages, such as unpasteurized fruit juices, raw milk, raw yogurt, or cider . Undercooked eggs, such as soft boiled, over easy, and poached; raw, unpasteurized eggs; or foods made with raw egg, such as homemade raw cookie dough and homemade mayonnaise Simple steps for food safety Shop smart. . Do not buy food stored or displayed in an unclean area. . Do not buy bruised or damaged fruits or vegetables. . Do not buy cans that have cracks, dents, or bulges. . Pick up foods that can spoil at the end of your shopping trip and store them in a cooler on the way home. Prepare and  clean up foods carefully. . Rinse all fresh fruits and vegetables under running water, and dry them with a clean towel or paper towel. . Clean the top of cans before opening them. . After preparing food, wash your hands for 20 seconds with hot water and soap. Pay special attention to areas between fingers and under nails. . Clean your utensils and dishes with hot water and soap. Marland Kitchen Disinfect your kitchen and cutting boards using 1 teaspoon of liquid, unscented bleach mixed into 1 quart of water.   Dispose of old food. . Eat canned and packaged food before its expiration date (the "use by" or "best before" date). . Consume refrigerated leftovers within 3 to 4 days. After that time, throw out the food. Even if the food does not smell or look spoiled, it still may be unsafe. Some bacteria, such as Listeria, can grow even on foods stored in the refrigerator if they are kept for too long. Take precautions when eating out. . At restaurants, avoid buffets and salad bars where food sits out for a long time and comes in contact with many people. Food can become contaminated when someone with a virus, often a norovirus, or another "bug" handles it. . Put any leftover food in a "to-go" container yourself, rather than having the server do it. And, refrigerate leftovers as soon as you get home. . Choose restaurants that  are clean and that are willing to prepare your food as you order it cooked.   MEDICATIONS:                                                                                                                                                                Compazine/Prochlorperazine 10mg  tablet. Take 1 tablet every 6 hours as needed for nausea/vomiting. (This can make you sleepy)   EMLA cream. Apply a quarter size amount to port site 1 hour prior to chemo. Do not rub in. Cover with plastic wrap.   Over-the-Counter Meds:  Colace - 100 mg capsules - take 2 capsules daily.  If this doesn't help then you can increase to 2 capsules twice daily.  Call us if this does not help your bowels move.   Imodium 2mg  capsule. Take 2 capsules after the 1st loose stool and then 1 capsule every 2 hours until you go a total of 12 hours without having a loose stool. Call the [facility] if loose stools continue. If diarrhea occurs at bedtime, take 2 capsules at bedtime. Then take 2 capsules every 4 hours until morning. Call [facility].    Diarrhea Sheet   If you are having loose stools/diarrhea, please purchase Imodium and begin taking as outlined:  At the first sign of poorly formed or loose stools you should begin taking Imodium (  loperamide) 2 mg capsules.  Take two caplets (4mg ) followed by one caplet (2mg ) every 2 hours until you have had no diarrhea for 12 hours.  During the night take two caplets (4mg ) at bedtime and continue every 4 hours during the night until the morning.  Stop taking Imodium only after there is no sign of diarrhea for 12 hours.    Always call the [facility] if you are having loose stools/diarrhea that you can't get under control.  Loose stools/diarrhea leads to dehydration (loss of water) in your body.  We have other options of trying to get the loose stools/diarrhea to stop but you must let us know!   Constipation Sheet  Colace - 100 mg capsules - take 2 capsules daily.  If  this doesn't help then you can increase to 2 capsules twice daily.  Please call if the above does not work for you.   Do not go more than 2 days without a bowel movement.  It is very important that you do not become constipated.  It will make you feel sick to your stomach (nausea) and can cause abdominal pain and vomiting.   Nausea Sheet   Compazine/Prochlorperazine 10mg  tablet. Take 1 tablet every 6 hours as needed for nausea/vomiting. (This can make you sleepy)  If you are having persistent nausea (nausea that does not stop) please call the [facility] and let us know the amount of nausea that you are experiencing.  If you begin to vomit, you need to call the [facility] and if it is the weekend and you have vomited more than one time and can't get it to stop-go to the Emergency Room.  Persistent nausea/vomiting can lead to dehydration (loss of fluid in your body) and will make you feel terrible.   Ice chips, sips of clear liquids, foods that are @ room temperature, crackers, and toast tend to be better tolerated.   SYMPTOMS TO REPORT AS SOON AS POSSIBLE AFTER TREATMENT:   FEVER GREATER THAN 100.5 F  CHILLS WITH OR WITHOUT FEVER  NAUSEA AND VOMITING THAT IS NOT CONTROLLED WITH YOUR NAUSEA MEDICATION  UNUSUAL SHORTNESS OF BREATH  UNUSUAL BRUISING OR BLEEDING  TENDERNESS IN MOUTH AND THROAT WITH OR WITHOUT PRESENCE OF ULCERS  URINARY PROBLEMS  BOWEL PROBLEMS  UNUSUAL RASH      Wear comfortable clothing and clothing appropriate for easy access to any Portacath or PICC line. Let us know if there is anything that we can do to make your therapy better!    What to do if you need assistance after hours or on the weekends: CALL [phone].  HOLD on the line, do not hang up.  You will hear multiple messages but at the end you will be connected with a nurse triage line.  They will contact the doctor if necessary.  Most of the time they will be able to assist you.  Do not  call the hospital operator.      I have been informed and understand all of the instructions given to me and have received a copy. I have been instructed to call the clinic [phone] or my family physician as soon as possible for continued medical care, if indicated. I do not have any more questions at this time but understand that I may call the [facility] or the Patient Navigator at [phone] during office hours should I have questions or need assistance in obtaining follow-up care.

## 2018-03-11 ENCOUNTER — Encounter (HOSPITAL_COMMUNITY): Payer: Self-pay | Admitting: *Deleted

## 2018-03-11 DIAGNOSIS — Z7189 Other specified counseling: Secondary | ICD-10-CM | POA: Insufficient documentation

## 2018-03-11 MED ORDER — PROCHLORPERAZINE MALEATE 10 MG PO TABS
10.0000 mg | ORAL_TABLET | Freq: Four times a day (QID) | ORAL | 1 refills | Status: AC | PRN
Start: 2018-03-11 — End: ?

## 2018-03-11 MED ORDER — LIDOCAINE-PRILOCAINE 2.5-2.5 % EX CREA
TOPICAL_CREAM | CUTANEOUS | 3 refills | Status: AC
Start: 2018-03-11 — End: ?

## 2018-03-11 NOTE — Progress Notes (Signed)
Foundation one ordered by Dr. Delton Coombes. He spoke directly to Dr. Saralyn Pilar.  I spoke with Maudie Mercury in pathology and gave her DX C78.6, C80.1 and Stage III.

## 2018-03-12 ENCOUNTER — Other Ambulatory Visit (HOSPITAL_COMMUNITY): Payer: Medicare Other

## 2018-03-12 ENCOUNTER — Inpatient Hospital Stay (HOSPITAL_COMMUNITY): Payer: Medicare Other

## 2018-03-12 ENCOUNTER — Other Ambulatory Visit: Payer: Self-pay

## 2018-03-12 ENCOUNTER — Encounter (HOSPITAL_COMMUNITY): Payer: Self-pay

## 2018-03-12 VITALS — BP 110/50 | HR 79 | Temp 98.4°F | Resp 16 | Wt 70.4 lb

## 2018-03-12 DIAGNOSIS — Z5111 Encounter for antineoplastic chemotherapy: Secondary | ICD-10-CM | POA: Diagnosis not present

## 2018-03-12 DIAGNOSIS — C801 Malignant (primary) neoplasm, unspecified: Principal | ICD-10-CM

## 2018-03-12 DIAGNOSIS — C786 Secondary malignant neoplasm of retroperitoneum and peritoneum: Secondary | ICD-10-CM

## 2018-03-12 DIAGNOSIS — Z7189 Other specified counseling: Secondary | ICD-10-CM

## 2018-03-12 LAB — CBC WITH DIFFERENTIAL/PLATELET
BASOS ABS: 0 10*3/uL (ref 0.0–0.1)
BASOS PCT: 0 %
EOS ABS: 0.4 10*3/uL (ref 0.0–0.7)
EOS PCT: 3 %
HCT: 33.7 % — ABNORMAL LOW (ref 36.0–46.0)
HEMOGLOBIN: 10.8 g/dL — AB (ref 12.0–15.0)
LYMPHS ABS: 1.1 10*3/uL (ref 0.7–4.0)
Lymphocytes Relative: 10 %
MCH: 26.7 pg (ref 26.0–34.0)
MCHC: 32 g/dL (ref 30.0–36.0)
MCV: 83.4 fL (ref 78.0–100.0)
Monocytes Absolute: 0.6 10*3/uL (ref 0.1–1.0)
Monocytes Relative: 5 %
NEUTROS PCT: 82 %
Neutro Abs: 9.2 10*3/uL — ABNORMAL HIGH (ref 1.7–7.7)
PLATELETS: 616 10*3/uL — AB (ref 150–400)
RBC: 4.04 MIL/uL (ref 3.87–5.11)
RDW: 15 % (ref 11.5–15.5)
WBC: 11.3 10*3/uL — ABNORMAL HIGH (ref 4.0–10.5)

## 2018-03-12 LAB — COMPREHENSIVE METABOLIC PANEL
ALK PHOS: 90 U/L (ref 38–126)
ALT: 10 U/L (ref 0–44)
AST: 15 U/L (ref 15–41)
Albumin: 2.4 g/dL — ABNORMAL LOW (ref 3.5–5.0)
Anion gap: 12 (ref 5–15)
BUN: 26 mg/dL — ABNORMAL HIGH (ref 8–23)
CALCIUM: 9 mg/dL (ref 8.9–10.3)
CO2: 27 mmol/L (ref 22–32)
CREATININE: 1.08 mg/dL — AB (ref 0.44–1.00)
Chloride: 96 mmol/L — ABNORMAL LOW (ref 98–111)
GFR calc Af Amer: 55 mL/min — ABNORMAL LOW (ref >60)
GFR calc non Af Amer: 47 mL/min — ABNORMAL LOW (ref >60)
GLUCOSE: 149 mg/dL — AB (ref 70–99)
Potassium: 4.3 mmol/L (ref 3.5–5.1)
SODIUM: 135 mmol/L (ref 135–145)
Total Bilirubin: 0.5 mg/dL (ref 0.3–1.2)
Total Protein: 6.8 g/dL (ref 6.5–8.1)

## 2018-03-12 MED ORDER — PALONOSETRON HCL INJECTION 0.25 MG/5ML
0.2500 mg | Freq: Once | INTRAVENOUS | Status: AC
Start: 2018-03-12 — End: 2018-03-12
  Administered 2018-03-12: 0.25 mg via INTRAVENOUS
  Filled 2018-03-12: qty 5

## 2018-03-12 MED ORDER — SODIUM CHLORIDE 0.9 % IV SOLN
92.0000 mg | Freq: Once | INTRAVENOUS | Status: AC
  Administered 2018-03-12: 90 mg via INTRAVENOUS
  Filled 2018-03-12: qty 9

## 2018-03-12 MED ORDER — PALONOSETRON HCL INJECTION 0.25 MG/5ML
INTRAVENOUS | Status: AC
  Filled 2018-03-12: qty 5

## 2018-03-12 MED ORDER — SODIUM CHLORIDE 0.9 % IV SOLN
Freq: Once | INTRAVENOUS | Status: AC
Start: 2018-03-12 — End: 2018-03-12
  Administered 2018-03-12: 12:00:00 via INTRAVENOUS

## 2018-03-12 MED ORDER — SODIUM CHLORIDE 0.9 % IV SOLN: 20.0000 mg | Freq: Once | INTRAVENOUS | Status: DC

## 2018-03-12 MED ORDER — FAMOTIDINE IN NACL 20-0.9 MG/50ML-% IV SOLN
20.0000 mg | Freq: Once | INTRAVENOUS | Status: AC
  Administered 2018-03-12: 20 mg via INTRAVENOUS
  Filled 2018-03-12: qty 50

## 2018-03-12 MED ORDER — SODIUM CHLORIDE 0.9 % IV SOLN
10.0000 mg | Freq: Once | INTRAVENOUS | Status: AC
  Administered 2018-03-12: 10 mg via INTRAVENOUS
  Filled 2018-03-12 (×2): qty 1

## 2018-03-12 NOTE — Patient Instructions (Addendum)
[facility] Discharge Instructions for Patients Receiving Chemotherapy   Beginning January 23rd 2017 lab work for the [facility] will be done in the  Main lab at [facility] on 1st floor. If you have a lab appointment with the [facility] please come in thru the  Main Entrance and check in at the main information desk   Today you received the following chemotherapy agents Carboplatin.   To help prevent nausea and vomiting after your treatment, we encourage you to take your nausea medication     If you develop nausea and vomiting, or diarrhea that is not controlled by your medication, call the clinic.  The clinic phone number is [phone]. Office hours are Monday-Friday 8:30am-5:00pm.  BELOW ARE SYMPTOMS THAT SHOULD BE REPORTED IMMEDIATELY:  *FEVER GREATER THAN 101.0 F  *CHILLS WITH OR WITHOUT FEVER  NAUSEA AND VOMITING THAT IS NOT CONTROLLED WITH YOUR NAUSEA MEDICATION  *UNUSUAL SHORTNESS OF BREATH  *UNUSUAL BRUISING OR BLEEDING  TENDERNESS IN MOUTH AND THROAT WITH OR WITHOUT PRESENCE OF ULCERS  *URINARY PROBLEMS  *BOWEL PROBLEMS  UNUSUAL RASH Items with * indicate a potential emergency and should be followed up as soon as possible. If you have an emergency after office hours please contact your primary care physician or go to the nearest emergency department.  Please call the clinic during office hours if you have any questions or concerns.   You may also contact the Patient Navigator at [phone] should you have any questions or need assistance in obtaining follow up care.      Resources For Cancer Patients and their Caregivers ? American Cancer Society: Can assist with transportation, wigs, general needs, runs Look Good Feel Better.        (785) 802-2400 ? Cancer Care: Provides financial assistance, online support groups, medication/co-pay assistance.  1-800-813-HOPE (908) 020-1652) ? [place] Assists [place] cancer patients and their families through emotional , educational and financial support.  416-104-0590 ? [place] DSS Where to apply for food stamps, Medicaid and utility assistance. 530-231-6918 ? [facility]: Transportation to medical appointments. 671-302-4833 ? Social Security Administration: May apply for disability if have a Stage IV cancer. 541-027-7501 251-790-6020 ? [employer], Disability and Transit Services: Assists with nutrition, care and transit needs. 440-723-3801

## 2018-03-12 NOTE — Progress Notes (Signed)
Labs reviewed with Dr. Cipriano Mile and creatinine noted by MD. Will proceed with 1st dose of carboplatin today. Packet given and both patient and daughter educated about her treatment plan. Consent obtained , patient and daughter verbalized understanding.   Treatment given per orders. Patient tolerated it well without problems. Vitals stable and discharged to rehab facility  from clinic via wheelchair with daughter. Follow up as scheduled.

## 2018-03-13 ENCOUNTER — Telehealth (HOSPITAL_COMMUNITY): Payer: Self-pay

## 2018-03-13 NOTE — Telephone Encounter (Signed)
24 hour follow up call- spoke with her daughter and she stated she is doing well today. No issues at this time.

## 2018-03-18 ENCOUNTER — Other Ambulatory Visit (HOSPITAL_COMMUNITY): Payer: Self-pay | Admitting: Nurse Practitioner

## 2018-03-18 ENCOUNTER — Other Ambulatory Visit (HOSPITAL_COMMUNITY): Payer: Self-pay | Admitting: *Deleted

## 2018-03-18 DIAGNOSIS — C801 Malignant (primary) neoplasm, unspecified: Principal | ICD-10-CM

## 2018-03-18 DIAGNOSIS — C786 Secondary malignant neoplasm of retroperitoneum and peritoneum: Secondary | ICD-10-CM

## 2018-03-18 NOTE — Progress Notes (Signed)
Para

## 2018-03-19 ENCOUNTER — Other Ambulatory Visit (HOSPITAL_COMMUNITY): Payer: Self-pay | Admitting: *Deleted

## 2018-03-20 ENCOUNTER — Inpatient Hospital Stay (HOSPITAL_COMMUNITY): Payer: No Typology Code available for payment source

## 2018-03-20 ENCOUNTER — Encounter (HOSPITAL_COMMUNITY): Payer: Self-pay | Admitting: Hematology

## 2018-03-20 ENCOUNTER — Other Ambulatory Visit: Payer: Self-pay

## 2018-03-20 ENCOUNTER — Inpatient Hospital Stay (HOSPITAL_COMMUNITY): Payer: No Typology Code available for payment source | Attending: Hematology | Admitting: Hematology

## 2018-03-20 VITALS — BP 125/64 | HR 81 | Temp 98.1°F | Resp 16 | Wt 70.8 lb

## 2018-03-20 DIAGNOSIS — Z79899 Other long term (current) drug therapy: Secondary | ICD-10-CM | POA: Diagnosis not present

## 2018-03-20 DIAGNOSIS — E860 Dehydration: Secondary | ICD-10-CM | POA: Insufficient documentation

## 2018-03-20 DIAGNOSIS — C786 Secondary malignant neoplasm of retroperitoneum and peritoneum: Secondary | ICD-10-CM | POA: Diagnosis present

## 2018-03-20 DIAGNOSIS — C801 Malignant (primary) neoplasm, unspecified: Principal | ICD-10-CM

## 2018-03-20 DIAGNOSIS — R531 Weakness: Secondary | ICD-10-CM | POA: Diagnosis not present

## 2018-03-20 DIAGNOSIS — R180 Malignant ascites: Secondary | ICD-10-CM | POA: Diagnosis not present

## 2018-03-20 LAB — COMPREHENSIVE METABOLIC PANEL
ALBUMIN: 2.5 g/dL — AB (ref 3.5–5.0)
ALT: 9 U/L (ref 0–44)
ANION GAP: 11 (ref 5–15)
AST: 15 U/L (ref 15–41)
Alkaline Phosphatase: 85 U/L (ref 38–126)
BUN: 19 mg/dL (ref 8–23)
CHLORIDE: 97 mmol/L — AB (ref 98–111)
CO2: 26 mmol/L (ref 22–32)
Calcium: 8.7 mg/dL — ABNORMAL LOW (ref 8.9–10.3)
Creatinine, Ser: 0.88 mg/dL (ref 0.44–1.00)
GFR calc Af Amer: >60 mL/min (ref >60)
GFR calc non Af Amer: >60 mL/min (ref >60)
Glucose, Bld: 117 mg/dL — ABNORMAL HIGH (ref 70–99)
POTASSIUM: 4.4 mmol/L (ref 3.5–5.1)
Sodium: 134 mmol/L — ABNORMAL LOW (ref 135–145)
Total Bilirubin: 0.4 mg/dL (ref 0.3–1.2)
Total Protein: 6.4 g/dL — ABNORMAL LOW (ref 6.5–8.1)

## 2018-03-20 LAB — CBC WITH DIFFERENTIAL/PLATELET
BASOS ABS: 0 10*3/uL (ref 0.0–0.1)
BASOS PCT: 0 %
EOS PCT: 4 %
Eosinophils Absolute: 0.3 10*3/uL (ref 0.0–0.7)
HCT: 32.6 % — ABNORMAL LOW (ref 36.0–46.0)
Hemoglobin: 10.3 g/dL — ABNORMAL LOW (ref 12.0–15.0)
Lymphocytes Relative: 15 %
Lymphs Abs: 1.2 10*3/uL (ref 0.7–4.0)
MCH: 26.2 pg (ref 26.0–34.0)
MCHC: 31.6 g/dL (ref 30.0–36.0)
MCV: 83 fL (ref 78.0–100.0)
MONO ABS: 0.4 10*3/uL (ref 0.1–1.0)
Monocytes Relative: 5 %
Neutro Abs: 6.3 10*3/uL (ref 1.7–7.7)
Neutrophils Relative %: 76 %
PLATELETS: 625 10*3/uL — AB (ref 150–400)
RBC: 3.93 MIL/uL (ref 3.87–5.11)
RDW: 15.6 % — AB (ref 11.5–15.5)
WBC: 8.3 10*3/uL (ref 4.0–10.5)

## 2018-03-20 MED ORDER — SODIUM CHLORIDE 0.9 % IV SOLN
INTRAVENOUS | Status: DC
  Administered 2018-03-20: 13:00:00 via INTRAVENOUS

## 2018-03-20 NOTE — Progress Notes (Signed)
Christy Valdez, [place] [address]   CLINIC:  Medical Oncology/Hematology  PCP:  Quentin Cornwall, MD [address], [address] [place] [address] [phone]   REASON FOR VISIT:  Follow-up for peritoneal carcinoma   CURRENT THERAPY: treatment on hold we will re-evaluate in 3 weeks  BRIEF ONCOLOGIC HISTORY:    Peritoneal carcinomatosis ([place])   01/28/2018 Initial Diagnosis    Peritoneal carcinomatosis ([place])    03/11/2018 -  Chemotherapy    The patient had palonosetron (ALOXI) injection 0.25 mg, 0.25 mg, Intravenous,  Once, 1 of 4 cycles Administration: 0.25 mg (03/12/2018) CARBOplatin (PARAPLATIN) 90 mg in sodium chloride 0.9 % 100 mL chemo infusion, 90 mg (100 % of original dose 92 mg), Intravenous,  Once, 1 of 4 cycles Dose modification:   (original dose 92 mg, Cycle 1) Administration: 90 mg (03/12/2018) PACLitaxel (TAXOL) 96 mg in sodium chloride 0.9 % 250 mL chemo infusion (</= 86m/m2), 80 mg/m2 = 96 mg, Intravenous,  Once, 0 of 3 cycles  for chemotherapy treatment.       INTERVAL HISTORY:  [name]89y.o. female returns for routine follow-up for peritoneal carcinoma. Patient is here today with her brother. Patient is feeling very weak and fatigued. She is unable to move around much. She is also unable to eat. She is being offered ensure daily however she is not drinking it. She feels hungry at times but is only able to eat a couple of bits and she is full. She states she feels dehydrated and needs fluids today. She wants to hold off on the chemo until she can get stronger. She is having to get paracentesis every 2 weeks. She states her appetite is 25%. She denies any new pains. Denies any vomiting or diarrhea. Denies any SOB.     REVIEW OF SYSTEMS:  Review of Systems  Constitutional: Positive for fatigue.  Gastrointestinal: Positive for nausea.  Neurological: Positive for extremity weakness.  All other systems reviewed and are  negative.    PAST MEDICAL/SURGICAL HISTORY:  Past Medical History:  Diagnosis Date  . Chronic back pain   . COPD (chronic obstructive pulmonary disease) ([place]   . DDD (degenerative disc disease), lumbar   . Depression   . Scoliosis    Past Surgical History:  Procedure Laterality Date  . CGustavus anterior approach  . COLONOSCOPY WITH ESOPHAGOGASTRODUODENOSCOPY (EGD) AND ESOPHAGEAL DILATION (ED)     remote past, ?2005  . ESOPHAGOGASTRODUODENOSCOPY N/A 01/28/2018   Procedure: ESOPHAGOGASTRODUODENOSCOPY (EGD);  Surgeon: RDaneil Dolin MD;  Location: [facility];  Service: Endoscopy;  Laterality: N/A;  with possible dilation      SOCIAL HISTORY:  Social History   Socioeconomic History  . Marital status: Married    Spouse name: Not on file  . Number of children: Not on file  . Years of education: Not on file  . Highest education level: Not on file  Occupational History  . Not on file  Social Needs  . Financial resource strain: Not on file  . Food insecurity:    Worry: Not on file    Inability: Not on file  . Transportation needs:    Medical: Not on file    Non-medical: Not on file  Tobacco Use  . Smoking status: Current Every Day Smoker    Packs/day: 1.00    Types: Cigarettes  . Smokeless tobacco: Never Used  Substance and Sexual Activity  .  Alcohol use: Never    Frequency: Never  . Drug use: Never  . Sexual activity: Not on file  Lifestyle  . Physical activity:    Days per week: Not on file    Minutes per session: Not on file  . Stress: Not on file  Relationships  . Social connections:    Talks on phone: Not on file    Gets together: Not on file    Attends religious service: Not on file    Active member of club or organization: Not on file    Attends meetings of clubs or organizations: Not on file    Relationship status: Not on file  . Intimate partner violence:    Fear of current or ex partner: Not on file    Emotionally  abused: Not on file    Physically abused: Not on file    Forced sexual activity: Not on file  Other Topics Concern  . Not on file  Social History Narrative  . Not on file    FAMILY HISTORY:  Family History  Problem Relation Age of Onset  . AAA (abdominal aortic aneurysm) Mother   . CVA Father   . Cancer Sister        Unspecified type.  Marland Kitchen CAD Brother        Died of MI  . Breast cancer Sister   . CAD Brother   . Colon cancer Neg Hx     CURRENT MEDICATIONS:  Outpatient Encounter Medications as of 03/20/2018  Medication Sig Note  . albuterol (PROVENTIL) (2.5 MG/3ML) 0.083% nebulizer solution Take 2.5 mg by nebulization every 6 (six) hours as needed for wheezing or shortness of breath.    . CARBOPLATIN IV Inject into the vein.   . carboxymethylcellulose (REFRESH TEARS) 0.5 % SOLN 1 drop 3 (three) times daily as needed.   . citalopram (CELEXA) 40 MG tablet Take 0.5 tablets (20 mg total) by mouth daily.   . Cranberry-Vitamin C-Inulin (UTI-STAT PO) Take by mouth.   . Fluticasone-Umeclidin-Vilant (TRELEGY ELLIPTA) 100-62.5-25 MCG/INH AEPB Inhale into the lungs.   . furosemide (LASIX) 20 MG tablet Take 1 tablet (20 mg total) by mouth daily.   Marland Kitchen lidocaine-prilocaine (EMLA) cream Apply to port site one hour prior to appointment   . linaclotide (LINZESS) 145 MCG CAPS capsule Take 1 capsule (145 mcg total) by mouth daily before breakfast.   . magnesium citrate SOLN Take 296 mLs (1 Bottle total) by mouth daily as needed for severe constipation.   . mirtazapine (REMERON) 30 MG tablet Take 30 mg by mouth at bedtime.   . ondansetron (ZOFRAN) 4 MG tablet Take 4 mg by mouth every 8 (eight) hours as needed for nausea or vomiting.   Marland Kitchen oxyCODONE-acetaminophen (PERCOCET/ROXICET) 5-325 MG tablet Take 1-2 tablets by mouth every 4 (four) hours as needed for moderate pain.   Marland Kitchen PACLitaxel (TAXOL IV) Inject into the vein.   . pantoprazole (PROTONIX) 40 MG tablet Take 1 tablet (40 mg total) by mouth daily.     . polyethylene glycol (MIRALAX / GLYCOLAX) packet Take 17 g by mouth daily.   . pramipexole (MIRAPEX) 0.125 MG tablet Take 0.125 mg by mouth at bedtime.    . prochlorperazine (COMPAZINE) 10 MG tablet Take 1 tablet (10 mg total) by mouth every 6 (six) hours as needed (Nausea or vomiting).   Marland Kitchen senna-docusate (SENOKOT-S) 8.6-50 MG tablet Take 2 tablets by mouth 2 (two) times daily.   Marland Kitchen SPIRIVA HANDIHALER 18 MCG inhalation capsule Place  18 mcg into inhaler and inhale daily.  01/26/2018: PATIENT HAS ON HAND BUT DOES NOT USE   . spironolactone (ALDACTONE) 25 MG tablet Take 0.5 tablets (12.5 mg total) by mouth daily.    No facility-administered encounter medications on file as of 03/20/2018.     ALLERGIES:  Allergies  Allergen Reactions  . Codeine Nausea And Vomiting     PHYSICAL EXAM:  ECOG Performance status: 1  Vitals:   03/20/18 1146  BP: 125/64  Pulse: 81  Resp: 16  Temp: 98.1 F (36.7 C)   Filed Weights   03/20/18 1146  Weight: 70 lb 12.3 oz (32.1 kg)    Physical Exam  Constitutional: She is oriented to person, place, and time.  malnourished   Cardiovascular: Normal rate, regular rhythm and normal heart sounds.  Pulmonary/Chest: Effort normal and breath sounds normal.  Abdominal: She exhibits distension.  Neurological: She is alert and oriented to person, place, and time.  Skin: Skin is warm and dry.  Abdomen: Ascites present.  Mild ascites.   LABORATORY DATA:  I have reviewed the labs as listed.  CBC    Component Value Date/Time   WBC 8.3 03/20/2018 1057   RBC 3.93 03/20/2018 1057   HGB 10.3 (L) 03/20/2018 1057   HCT 32.6 (L) 03/20/2018 1057   PLT 625 (H) 03/20/2018 1057   MCV 83.0 03/20/2018 1057   MCH 26.2 03/20/2018 1057   MCHC 31.6 03/20/2018 1057   RDW 15.6 (H) 03/20/2018 1057   LYMPHSABS 1.2 03/20/2018 1057   MONOABS 0.4 03/20/2018 1057   EOSABS 0.3 03/20/2018 1057   BASOSABS 0.0 03/20/2018 1057   CMP Latest Ref Rng & Units 03/20/2018 03/12/2018  01/29/2018  Glucose 70 - 99 mg/dL 117(H) 149(H) 84  BUN 8 - 23 mg/dL 19 26(H) 13  Creatinine 0.44 - 1.00 mg/dL 0.88 1.08(H) 0.58  Sodium 135 - 145 mmol/L 134(L) 135 135  Potassium 3.5 - 5.1 mmol/L 4.4 4.3 4.0  Chloride 98 - 111 mmol/L 97(L) 96(L) 105  CO2 22 - 32 mmol/L 26 27 25   Calcium 8.9 - 10.3 mg/dL 8.7(L) 9.0 8.0(L)  Total Protein 6.5 - 8.1 g/dL 6.4(L) 6.8 -  Total Bilirubin 0.3 - 1.2 mg/dL 0.4 0.5 -  Alkaline Phos 38 - 126 U/L 85 90 -  AST 15 - 41 U/L 15 15 -  ALT 0 - 44 U/L 9 10 -        ASSESSMENT & PLAN:   Peritoneal carcinomatosis (HCC) 1.  Peritoneal carcinomatosis: - Admitted to the hospital from 01/26/2018 through 02/02/2018 with severe weakness, abdominal distention and 20 pound weight loss over the last 3 months.  Quit smoking 2 weeks ago, smoked 1 pack/day for 50 years. - CT scan of the abdomen and pelvis with contrast dated 01/26/2018 shows prominent ascites with enhancing margins especially posteriorly in the pelvis, suspected omental and mesenteric caking of tumor in the left upper quadrant and along the transverse colon mesentery suspicious for mucinous colon cancer versus ovarian cancer.  There was marked thickening of the sigmoid colon could be from diverticulosis or tumor. -EGD on 01/28/2018 was within normal limits. -Ultrasound paracentesis on 01/28/2018 done with 2.2 L of fluid removed with cytology showing malignant cells consistent with metastatic adenocarcinoma.  This was positive for CK7 and p53.  Negative for TTF-1, CK 20 and CDX 2.  Overall Immuno profile points towards a gynecologic primary. - Patient currently at a rehab facility in [place].  CA 125 elevated at 91.  CEA was 6.2. -I have discussed with the interventional radiologist, who felt that peritoneal biopsy is risky in her. - She had a paracentesis done on 03/06/2018 and 2.5 L was taken out.  Pathology was reported as adenocarcinoma.  I have called and talked to Dr. Claudette Laws, who is the  pathologist, who felt that there is enough cellblock to send for foundation 1 testing. -I discussed the results of the CT of the chest dated 02/25/2018 which did not show any evidence of metastatic disease. - We had a prolonged discussion about best supportive care in the form of hospice versus treatment with low-dose chemotherapy.  Obviously she is not a candidate for full dose carboplatin and paclitaxel.  Upon hearing pros and cons of both options, she wanted to try chemotherapy.  If she does not tolerate it well, she will quit.  I have recommended starting out with carboplatin AUC 2 weekly with the intention of adding paclitaxel 40 to 50 mg/m.  This will be continued for 18 weeks.  We talked about the side effects in detail.  She agrees and gives [place] permission to proceed with the treatment.  I plan to schedule it later this week. -She did receive first weekly carboplatin AUC 2 on 03/12/2018.  She is brought in by her brother today.  She has not been eating well.  She is also complaining of decreased taste.  We have decided to hold chemotherapy at this time.  If she continues to deteriorate at next visit, we will transition her to hospice.  Today she will receive normal saline as she is feeling weak and dehydrated.  2.  Malignant ascites: -She will continue Lasix 20 mg daily and spironolactone 25 mg daily.  3.  Family history: -Sister had metastatic breast cancer.  Another sister had cancer with lymph node in the neck removed.  Patient does not know the type of cancer.  Niece reportedly had colon cancer.  I would make a referral to our geneticist for BRCA1/2 testing.      Orders placed this encounter:  Orders Placed This Encounter  Procedures  . CBC with Differential/Platelet  . Comprehensive metabolic panel      Derek Jack, MD [place] [phone]

## 2018-03-20 NOTE — Progress Notes (Signed)
Hydration fluids given per orders. Patient tolerated it well without problems. Vitals stable and discharged home from clinic via wheelchair. Follow up as scheduled.  

## 2018-03-20 NOTE — Progress Notes (Signed)
Nutrition Follow-up:  Patient with peritoneal carcinomatosis.  Patient had first chemotherapy on 03/11/2018.    Met with patient during infusion of IV fluids.  Chemotherapy on hold today due to weakness.  Spoke with patient as brother out in waiting area.  Daughter at work today and unable to come with patient.  Patient unable to tell RD what she was able to eat this am for breakfast.  Stated that "they (referring to SNF) have powered eggs."  Eating peanut butter crackers while talking to RD.  Gingerale and ensure enlive at chairside as well.  Reports that she is not drinking ensure on a regular basis.  "I forget to ask for it."  "My daughter talked to me about asking for it last night and eating more to get stronger."  "Food does not taste right." Patient reports that SNF has fish and potatoes when asked if she ate that she said "I didn't eat the fish but ate few bites of the potatoes."  Asked patient for permission to speak with brother.  Patient agreeable.  Met with brother and he reports her appetite still continues to be poor.  She will eat a few bites then be done.  We bring her food to eat, whatever she says she wants we bring it to her and she will eat just a few bites.  Did eat most of a ice cream sundae recently.  Brother reports he has not seen a nutrition shake on patients meal tray but she has them in her room.  Brother reports she does not really like them and forgets to ask for them.  Reports that she moved out of the rehab section of the nursing home on Tuesday of this week into another room.  He is not sure if her sign for eating and ask for ensure/boost got moved or not (dtr made sign for patient).  Brother reports patient ate jello today and that has been it until coming to clinic.    Medications: reviewed  Labs: reviewed  Anthropometrics:   Weight stable at 70 lb today same as visit on 8/27   NUTRITION DIAGNOSIS:  Severe malnutrition continues   MALNUTRITION DIAGNOSIS: Sever  malnutrition continues   INTERVENTION:  Encouraged brother to get sign back up in patient's new room.  Maybe helpful if facility can add oral nutrition supplement to meal tray or send it between meals. Reviewed high calorie, high protein foods with brother for patient to consume.   Gave brother 2nd case of ensure enlive for patient.      MONITORING, EVALUATION, GOAL: po intake, supplement acceptance, labs, weight trends, treatment plan   NEXT VISIT: September 27 during infusion  Layken Beg B. Zenia Resides, [place], [place] Registered Dietitian [phone] (pager)

## 2018-03-20 NOTE — Assessment & Plan Note (Addendum)
1.  Peritoneal carcinomatosis: - Admitted to the hospital from 01/26/2018 through 02/02/2018 with severe weakness, abdominal distention and 20 pound weight loss over the last 3 months.  Quit smoking 2 weeks ago, smoked 1 pack/day for 50 years. - CT scan of the abdomen and pelvis with contrast dated 01/26/2018 shows prominent ascites with enhancing margins especially posteriorly in the pelvis, suspected omental and mesenteric caking of tumor in the left upper quadrant and along the transverse colon mesentery suspicious for mucinous colon cancer versus ovarian cancer.  There was marked thickening of the sigmoid colon could be from diverticulosis or tumor. -EGD on 01/28/2018 was within normal limits. -Ultrasound paracentesis on 01/28/2018 done with 2.2 L of fluid removed with cytology showing malignant cells consistent with metastatic adenocarcinoma.  This was positive for CK7 and p53.  Negative for TTF-1, CK 20 and CDX 2.  Overall Immuno profile points towards a gynecologic primary. - Patient currently at a rehab facility in [place].  CA 125 elevated at 91.  CEA was 6.2. -I have discussed with the interventional radiologist, who felt that peritoneal biopsy is risky in her. - She had a paracentesis done on 03/06/2018 and 2.5 L was taken out.  Pathology was reported as adenocarcinoma.  I have called and talked to Dr. Claudette Laws, who is the pathologist, who felt that there is enough cellblock to send for foundation 1 testing. -I discussed the results of the CT of the chest dated 02/25/2018 which did not show any evidence of metastatic disease. - We had a prolonged discussion about best supportive care in the form of hospice versus treatment with low-dose chemotherapy.  Obviously she is not a candidate for full dose carboplatin and paclitaxel.  Upon hearing pros and cons of both options, she wanted to try chemotherapy.  If she does not tolerate it well, she will quit.  I have recommended starting out with  carboplatin AUC 2 weekly with the intention of adding paclitaxel 40 to 50 mg/m.  This will be continued for 18 weeks.  We talked about the side effects in detail.  She agrees and gives [place] permission to proceed with the treatment.  I plan to schedule it later this week. -She did receive first weekly carboplatin AUC 2 on 03/12/2018.  She is brought in by her brother today.  She has not been eating well.  She is also complaining of decreased taste.  We have decided to hold chemotherapy at this time.  If she continues to deteriorate at next visit, we will transition her to hospice.  Today she will receive normal saline as she is feeling weak and dehydrated.  2.  Malignant ascites: -She will continue Lasix 20 mg daily and spironolactone 25 mg daily.  3.  Family history: -Sister had metastatic breast cancer.  Another sister had cancer with lymph node in the neck removed.  Patient does not know the type of cancer.  Niece reportedly had colon cancer.  I would make a referral to our geneticist for BRCA1/2 testing.

## 2018-03-20 NOTE — Patient Instructions (Signed)
[facility] at [facility] Discharge Instructions  Follow up in 3 weeks with labs   Thank you for choosing [facility] at [facility] to provide your oncology and hematology care.  To afford each patient quality time with our provider, please arrive at least 15 minutes before your scheduled appointment time.   If you have a lab appointment with the Cancer Center please come in thru the  Main Entrance and check in at the main information desk  You need to re-schedule your appointment should you arrive 10 or more minutes late.  We strive to give you quality time with our providers, and arriving late affects you and other patients whose appointments are after yours.  Also, if you no show three or more times for appointments you may be dismissed from the clinic at the providers discretion.     Again, thank you for choosing [facility].  Our hope is that these requests will decrease the amount of time that you wait before being seen by our physicians.       _____________________________________________________________  Should you have questions after your visit to [facility], please contact our office at [phone] between the hours of 8:00 a.m. and 4:30 p.m.  Voicemails left after 4:00 p.m. will not be returned until the following business day.  For prescription refill requests, have your pharmacy contact our office and allow 72 hours.    Cancer Center Support Programs:   > Cancer Support Group  2nd Tuesday of the month 1pm-2pm, Journey Room    

## 2018-03-24 ENCOUNTER — Encounter (HOSPITAL_COMMUNITY): Payer: Self-pay | Admitting: Hematology

## 2018-03-26 ENCOUNTER — Ambulatory Visit (HOSPITAL_COMMUNITY)
Admission: RE | Admit: 2018-03-26 | Discharge: 2018-03-26 | Disposition: A | Discharge status: Home / Self Care | Payer: No Typology Code available for payment source | Source: Ambulatory Visit | Attending: Nurse Practitioner | Admitting: Nurse Practitioner

## 2018-03-26 ENCOUNTER — Other Ambulatory Visit (HOSPITAL_COMMUNITY): Payer: Self-pay | Admitting: Nurse Practitioner

## 2018-03-26 DIAGNOSIS — C801 Malignant (primary) neoplasm, unspecified: Principal | ICD-10-CM

## 2018-03-26 DIAGNOSIS — R188 Other ascites: Secondary | ICD-10-CM | POA: Diagnosis not present

## 2018-03-26 DIAGNOSIS — C786 Secondary malignant neoplasm of retroperitoneum and peritoneum: Secondary | ICD-10-CM | POA: Insufficient documentation

## 2018-04-01 ENCOUNTER — Other Ambulatory Visit (HOSPITAL_COMMUNITY): Payer: Self-pay | Admitting: Nurse Practitioner

## 2018-04-01 DIAGNOSIS — C801 Malignant (primary) neoplasm, unspecified: Principal | ICD-10-CM

## 2018-04-01 DIAGNOSIS — C786 Secondary malignant neoplasm of retroperitoneum and peritoneum: Secondary | ICD-10-CM

## 2018-04-02 ENCOUNTER — Encounter (HOSPITAL_COMMUNITY): Payer: Self-pay

## 2018-04-02 ENCOUNTER — Ambulatory Visit (HOSPITAL_COMMUNITY)
Admission: RE | Admit: 2018-04-02 | Discharge: 2018-04-02 | Disposition: A | Discharge status: Home / Self Care | Payer: Medicare Other | Source: Ambulatory Visit | Attending: Nurse Practitioner | Admitting: Nurse Practitioner

## 2018-04-02 DIAGNOSIS — C786 Secondary malignant neoplasm of retroperitoneum and peritoneum: Secondary | ICD-10-CM | POA: Diagnosis not present

## 2018-04-02 DIAGNOSIS — R188 Other ascites: Secondary | ICD-10-CM | POA: Diagnosis not present

## 2018-04-02 DIAGNOSIS — C801 Malignant (primary) neoplasm, unspecified: Secondary | ICD-10-CM

## 2018-04-02 NOTE — Progress Notes (Signed)
Paracentesis complete no signs of distress.  

## 2018-04-10 ENCOUNTER — Inpatient Hospital Stay (HOSPITAL_BASED_OUTPATIENT_CLINIC_OR_DEPARTMENT_OTHER): Payer: No Typology Code available for payment source | Admitting: Hematology

## 2018-04-10 ENCOUNTER — Inpatient Hospital Stay (HOSPITAL_COMMUNITY): Payer: No Typology Code available for payment source

## 2018-04-10 ENCOUNTER — Encounter (HOSPITAL_COMMUNITY): Payer: Self-pay | Admitting: Hematology

## 2018-04-10 ENCOUNTER — Other Ambulatory Visit: Payer: Self-pay

## 2018-04-10 DIAGNOSIS — E860 Dehydration: Secondary | ICD-10-CM | POA: Diagnosis not present

## 2018-04-10 DIAGNOSIS — C786 Secondary malignant neoplasm of retroperitoneum and peritoneum: Secondary | ICD-10-CM | POA: Diagnosis not present

## 2018-04-10 DIAGNOSIS — R180 Malignant ascites: Secondary | ICD-10-CM

## 2018-04-10 DIAGNOSIS — C801 Malignant (primary) neoplasm, unspecified: Principal | ICD-10-CM

## 2018-04-10 DIAGNOSIS — Z79899 Other long term (current) drug therapy: Secondary | ICD-10-CM

## 2018-04-10 DIAGNOSIS — R531 Weakness: Secondary | ICD-10-CM

## 2018-04-10 LAB — CBC WITH DIFFERENTIAL/PLATELET
Basophils Absolute: 0 10*3/uL (ref 0.0–0.1)
Basophils Relative: 0 %
EOS ABS: 0.2 10*3/uL (ref 0.0–0.7)
Eosinophils Relative: 2 %
HCT: 32.1 % — ABNORMAL LOW (ref 36.0–46.0)
HEMOGLOBIN: 10.2 g/dL — AB (ref 12.0–15.0)
LYMPHS ABS: 1.7 10*3/uL (ref 0.7–4.0)
Lymphocytes Relative: 21 %
MCH: 27.1 pg (ref 26.0–34.0)
MCHC: 31.8 g/dL (ref 30.0–36.0)
MCV: 85.4 fL (ref 78.0–100.0)
Monocytes Absolute: 0.5 10*3/uL (ref 0.1–1.0)
Monocytes Relative: 6 %
NEUTROS PCT: 71 %
Neutro Abs: 5.7 10*3/uL (ref 1.7–7.7)
Platelets: 504 10*3/uL — ABNORMAL HIGH (ref 150–400)
RBC: 3.76 MIL/uL — AB (ref 3.87–5.11)
RDW: 19 % — ABNORMAL HIGH (ref 11.5–15.5)
WBC: 8.1 10*3/uL (ref 4.0–10.5)

## 2018-04-10 LAB — COMPREHENSIVE METABOLIC PANEL
ALBUMIN: 2.7 g/dL — AB (ref 3.5–5.0)
ALK PHOS: 97 U/L (ref 38–126)
ALT: 8 U/L (ref 0–44)
AST: 16 U/L (ref 15–41)
Anion gap: 10 (ref 5–15)
BUN: 30 mg/dL — AB (ref 8–23)
CO2: 24 mmol/L (ref 22–32)
CREATININE: 1 mg/dL (ref 0.44–1.00)
Calcium: 9.1 mg/dL (ref 8.9–10.3)
Chloride: 101 mmol/L (ref 98–111)
GFR calc non Af Amer: 52 mL/min — ABNORMAL LOW (ref >60)
GLUCOSE: 172 mg/dL — AB (ref 70–99)
Potassium: 4.1 mmol/L (ref 3.5–5.1)
SODIUM: 135 mmol/L (ref 135–145)
Total Bilirubin: 0.5 mg/dL (ref 0.3–1.2)
Total Protein: 7 g/dL (ref 6.5–8.1)

## 2018-04-10 MED ORDER — OXYCODONE-ACETAMINOPHEN 5-325 MG PO TABS
1.0000 | ORAL_TABLET | Freq: Once | ORAL | Status: AC
  Administered 2018-04-10: 1 via ORAL

## 2018-04-10 MED ORDER — SODIUM CHLORIDE 0.9 % IV SOLN
INTRAVENOUS | Status: DC
  Administered 2018-04-10: 13:00:00 via INTRAVENOUS

## 2018-04-10 MED ORDER — OXYCODONE-ACETAMINOPHEN 5-325 MG PO TABS
ORAL_TABLET | ORAL | Status: AC
  Filled 2018-04-10: qty 1

## 2018-04-10 NOTE — Progress Notes (Signed)
500 ml bolus of NS today and pain medication given per Dr. Delton Coombes.   Treatment given per orders. Patient tolerated it well without problems. Vitals stable and discharged home from clinic via wheelchair.  Follow up as scheduled.

## 2018-04-10 NOTE — Patient Instructions (Signed)
[facility] at [facility] Discharge Instructions     Thank you for choosing [facility] at [facility] to provide your oncology and hematology care.  To afford each patient quality time with our provider, please arrive at least 15 minutes before your scheduled appointment time.   If you have a lab appointment with the Cancer Center please come in thru the  Main Entrance and check in at the main information desk  You need to re-schedule your appointment should you arrive 10 or more minutes late.  We strive to give you quality time with our providers, and arriving late affects you and other patients whose appointments are after yours.  Also, if you no show three or more times for appointments you may be dismissed from the clinic at the providers discretion.     Again, thank you for choosing [facility].  Our hope is that these requests will decrease the amount of time that you wait before being seen by our physicians.       _____________________________________________________________  Should you have questions after your visit to [facility], please contact our office at [phone] between the hours of 8:00 a.m. and 4:30 p.m.  Voicemails left after 4:00 p.m. will not be returned until the following business day.  For prescription refill requests, have your pharmacy contact our office and allow 72 hours.    Cancer Center Support Programs:   > Cancer Support Group  2nd Tuesday of the month 1pm-2pm, Journey Room    

## 2018-04-10 NOTE — Assessment & Plan Note (Signed)
1.  Peritoneal carcinomatosis: - Admitted to the hospital from 01/26/2018 through 02/02/2018 with severe weakness, abdominal distention and 20 pound weight loss over the last 3 months.  Quit smoking 2 weeks ago, smoked 1 pack/day for 50 years. - CT scan of the abdomen and pelvis with contrast dated 01/26/2018 shows prominent ascites with enhancing margins especially posteriorly in the pelvis, suspected omental and mesenteric caking of tumor in the left upper quadrant and along the transverse colon mesentery suspicious for mucinous colon cancer versus ovarian cancer.  There was marked thickening of the sigmoid colon could be from diverticulosis or tumor. -EGD on 01/28/2018 was within normal limits. -Ultrasound paracentesis on 01/28/2018 done with 2.2 L of fluid removed with cytology showing malignant cells consistent with metastatic adenocarcinoma.  This was positive for CK7 and p53.  Negative for TTF-1, CK 20 and CDX 2.  Overall Immuno profile points towards a gynecologic primary. - Patient currently at a rehab facility in [place].  CA 125 elevated at 91.  CEA was 6.2. -I have discussed with the interventional radiologist, who felt that peritoneal biopsy is risky in her. - She had a paracentesis done on 03/06/2018 and 2.5 L was taken out.  Pathology was reported as adenocarcinoma.  I have called and talked to Dr. Claudette Laws, who is the pathologist, who felt that there is enough cellblock to send for foundation 1 testing. -I discussed the results of the CT of the chest dated 02/25/2018 which did not show any evidence of metastatic disease. -She received 1 dose of carboplatin AUC 2 on 03/12/2018. - Her eating has not improved in the last 3 weeks.  She remains to be very weak.  She has developed right upper quadrant pain for which she is taking oxycodone 1 tablet/day. -Today I talked to her again about best supportive care in the form of hospice.  She is agreeable to this option.  We will make a  referral.  She will receive half liter of normal saline today as she is dehydrated.  2.  Malignant ascites: -Is on Lasix and spironolactone.  Last paracentesis on 04/02/2018, with 0.9 L of fluid removed.  3.  Family history: -Sister had metastatic breast cancer.  Another sister had cancer with lymph node in the neck removed.  Patient does not know the type of cancer.  Niece reportedly had colon cancer.

## 2018-04-10 NOTE — Progress Notes (Signed)
Nutrition  Patient has been referred to hospice care.  Did not see patient today due to current plan of care.    Christy Valdez B. Zenia Resides, [place], [place] Registered Dietitian [phone] (pager)

## 2018-04-10 NOTE — Progress Notes (Signed)
[place], [place] [address]   CLINIC:  Medical Oncology/Hematology  PCP:  Christy Cornwall, MD [address], [address] [place] [address] [phone]   REASON FOR VISIT:  Follow-up for peritoneal carcinoma   CURRENT THERAPY: refer to Hospice  BRIEF ONCOLOGIC HISTORY:    Peritoneal carcinomatosis ([place])   01/28/2018 Initial Diagnosis    Peritoneal carcinomatosis ([place])    03/11/2018 -  Chemotherapy    The patient had palonosetron (ALOXI) injection 0.25 mg, 0.25 mg, Intravenous,  Once, 1 of 4 cycles Administration: 0.25 mg (03/12/2018) CARBOplatin (PARAPLATIN) 90 mg in sodium chloride 0.9 % 100 mL chemo infusion, 90 mg (100 % of original dose 92 mg), Intravenous,  Once, 1 of 4 cycles Dose modification:   (original dose 92 mg, Cycle 1) Administration: 90 mg (03/12/2018) PACLitaxel (TAXOL) 96 mg in sodium chloride 0.9 % 250 mL chemo infusion (</= 68m/m2), 80 mg/m2 = 96 mg, Intravenous,  Once, 0 of 3 cycles  for chemotherapy treatment.      INTERVAL HISTORY:  [name] returns for routine follow-up for peritoneal carcinoma. Patient here today with with her brother. She feels SOB all the time. She has pain in her right upper quadrant that is constant. It just started today. She stopped drinking the breeze due to diarrhea. She has a decreased appetite and no energy. Denies any nausea or vomiting. Denie any chest pain. Denies any headaches or vision changes. Patient reports she is dehydrated and needs fluids and pain medication. Hospice was also discussed with the patient and she will be referred.    REVIEW OF SYSTEMS:  Review of Systems  Constitutional: Positive for fatigue.  HENT:   Positive for trouble swallowing.   Respiratory: Positive for shortness of breath.   All other systems reviewed and are negative.    PAST MEDICAL/SURGICAL HISTORY:  Past Medical History:  Diagnosis Date  . Chronic back pain   . COPD (chronic  obstructive pulmonary disease) ([place]   . DDD (degenerative disc disease), lumbar   . Depression   . Scoliosis    Past Surgical History:  Procedure Laterality Date  . [place] anterior approach  . COLONOSCOPY WITH ESOPHAGOGASTRODUODENOSCOPY (EGD) AND ESOPHAGEAL DILATION (ED)     remote past, ?2005  . ESOPHAGOGASTRODUODENOSCOPY N/A 01/28/2018   Procedure: ESOPHAGOGASTRODUODENOSCOPY (EGD);  Surgeon: RDaneil Dolin MD;  Location: [facility];  Service: Endoscopy;  Laterality: N/A;  with possible dilation      SOCIAL HISTORY:  Social History   Socioeconomic History  . Marital status: Widowed    Spouse name: Not on file  . Number of children: Not on file  . Years of education: Not on file  . Highest education level: Not on file  Occupational History  . Not on file  Social Needs  . Financial resource strain: Not on file  . Food insecurity:    Worry: Not on file    Inability: Not on file  . Transportation needs:    Medical: Not on file    Non-medical: Not on file  Tobacco Use  . Smoking status: Current Every Day Smoker    Packs/day: 1.00    Types: Cigarettes  . Smokeless tobacco: Never Used  Substance and Sexual Activity  . Alcohol use: Never    Frequency: Never  . Drug use: Never  . Sexual activity: Not on file  Lifestyle  . Physical activity:  Days per week: Not on file    Minutes per session: Not on file  . Stress: Not on file  Relationships  . Social connections:    Talks on phone: Not on file    Gets together: Not on file    Attends religious service: Not on file    Active member of club or organization: Not on file    Attends meetings of clubs or organizations: Not on file    Relationship status: Not on file  . Intimate partner violence:    Fear of current or ex partner: Not on file    Emotionally abused: Not on file    Physically abused: Not on file    Forced sexual activity: Not on file  Other Topics Concern  . Not on file    Social History Narrative  . Not on file    FAMILY HISTORY:  Family History  Problem Relation Age of Onset  . AAA (abdominal aortic aneurysm) Mother   . CVA Father   . Cancer Sister        Unspecified type.  Marland Kitchen CAD Brother        Died of MI  . Breast cancer Sister   . CAD Brother   . Colon cancer Neg Hx     CURRENT MEDICATIONS:  Outpatient Encounter Medications as of 04/10/2018  Medication Sig Note  . albuterol (PROVENTIL) (2.5 MG/3ML) 0.083% nebulizer solution Take 2.5 mg by nebulization every 6 (six) hours as needed for wheezing or shortness of breath.    . CARBOPLATIN IV Inject into the vein.   . carboxymethylcellulose (REFRESH TEARS) 0.5 % SOLN 1 drop 3 (three) times daily as needed.   . citalopram (CELEXA) 40 MG tablet Take 0.5 tablets (20 mg total) by mouth daily.   . Cranberry-Vitamin C-Inulin (UTI-STAT PO) Take by mouth.   . Fluticasone-Umeclidin-Vilant (TRELEGY ELLIPTA) 100-62.5-25 MCG/INH AEPB Inhale into the lungs.   . furosemide (LASIX) 20 MG tablet Take 1 tablet (20 mg total) by mouth daily.   Marland Kitchen lidocaine-prilocaine (EMLA) cream Apply to port site one hour prior to appointment   . linaclotide (LINZESS) 145 MCG CAPS capsule Take 1 capsule (145 mcg total) by mouth daily before breakfast.   . magnesium citrate SOLN Take 296 mLs (1 Bottle total) by mouth daily as needed for severe constipation.   . mirtazapine (REMERON) 30 MG tablet Take 30 mg by mouth at bedtime.   . ondansetron (ZOFRAN) 4 MG tablet Take 4 mg by mouth every 8 (eight) hours as needed for nausea or vomiting.   Marland Kitchen oxyCODONE-acetaminophen (PERCOCET/ROXICET) 5-325 MG tablet Take 1-2 tablets by mouth every 4 (four) hours as needed for moderate pain.   Marland Kitchen PACLitaxel (TAXOL IV) Inject into the vein.   . pantoprazole (PROTONIX) 40 MG tablet Take 1 tablet (40 mg total) by mouth daily.   . polyethylene glycol (MIRALAX / GLYCOLAX) packet Take 17 g by mouth daily.   . pramipexole (MIRAPEX) 0.125 MG tablet Take 0.125 mg  by mouth at bedtime.    . prochlorperazine (COMPAZINE) 10 MG tablet Take 1 tablet (10 mg total) by mouth every 6 (six) hours as needed (Nausea or vomiting).   Marland Kitchen senna-docusate (SENOKOT-S) 8.6-50 MG tablet Take 2 tablets by mouth 2 (two) times daily.   Marland Kitchen SPIRIVA HANDIHALER 18 MCG inhalation capsule Place 18 mcg into inhaler and inhale daily.  01/26/2018: PATIENT HAS ON HAND BUT DOES NOT USE   . spironolactone (ALDACTONE) 25 MG tablet Take 0.5 tablets (12.5  mg total) by mouth daily.    No facility-administered encounter medications on file as of 04/10/2018.     ALLERGIES:  Allergies  Allergen Reactions  . Codeine Nausea And Vomiting     PHYSICAL EXAM:  ECOG Performance status: 1  VITAL SIGNS: BP: 110/93, P:86, R: 18, T:98.0, SATS:84% Weight: 72.3  Physical Exam  Constitutional: She is oriented to person, place, and time.  Malnurished, sunken eyes, pale  Abdominal: She exhibits distension. There is tenderness.  Neurological: She is alert and oriented to person, place, and time.  Skin: There is pallor.  Psychiatric: She has a normal mood and affect. Her behavior is normal. Judgment and thought content normal.     LABORATORY DATA:  I have reviewed the labs as listed.  CBC    Component Value Date/Time   WBC 8.1 04/10/2018 1035   RBC 3.76 (L) 04/10/2018 1035   HGB 10.2 (L) 04/10/2018 1035   HCT 32.1 (L) 04/10/2018 1035   PLT 504 (H) 04/10/2018 1035   MCV 85.4 04/10/2018 1035   MCH 27.1 04/10/2018 1035   MCHC 31.8 04/10/2018 1035   RDW 19.0 (H) 04/10/2018 1035   LYMPHSABS 1.7 04/10/2018 1035   MONOABS 0.5 04/10/2018 1035   EOSABS 0.2 04/10/2018 1035   BASOSABS 0.0 04/10/2018 1035   CMP Latest Ref Rng & Units 04/10/2018 03/20/2018 03/12/2018  Glucose 70 - 99 mg/dL 172(H) 117(H) 149(H)  BUN 8 - 23 mg/dL 30(H) 19 26(H)  Creatinine 0.44 - 1.00 mg/dL 1.00 0.88 1.08(H)  Sodium 135 - 145 mmol/L 135 134(L) 135  Potassium 3.5 - 5.1 mmol/L 4.1 4.4 4.3  Chloride 98 - 111 mmol/L 101  97(L) 96(L)  CO2 22 - 32 mmol/L 24 26 27   Calcium 8.9 - 10.3 mg/dL 9.1 8.7(L) 9.0  Total Protein 6.5 - 8.1 g/dL 7.0 6.4(L) 6.8  Total Bilirubin 0.3 - 1.2 mg/dL 0.5 0.4 0.5  Alkaline Phos 38 - 126 U/L 97 85 90  AST 15 - 41 U/L 16 15 15   ALT 0 - 44 U/L 8 9 10        ASSESSMENT & PLAN:   Peritoneal carcinomatosis ([place]) 1.  Peritoneal carcinomatosis: - Admitted to the hospital from 01/26/2018 through 02/02/2018 with severe weakness, abdominal distention and 20 pound weight loss over the last 3 months.  Quit smoking 2 weeks ago, smoked 1 pack/day for 50 years. - CT scan of the abdomen and pelvis with contrast dated 01/26/2018 shows prominent ascites with enhancing margins especially posteriorly in the pelvis, suspected omental and mesenteric caking of tumor in the left upper quadrant and along the transverse colon mesentery suspicious for mucinous colon cancer versus ovarian cancer.  There was marked thickening of the sigmoid colon could be from diverticulosis or tumor. -EGD on 01/28/2018 was within normal limits. -Ultrasound paracentesis on 01/28/2018 done with 2.2 L of fluid removed with cytology showing malignant cells consistent with metastatic adenocarcinoma.  This was positive for CK7 and p53.  Negative for TTF-1, CK 20 and CDX 2.  Overall Immuno profile points towards a gynecologic primary. - Patient currently at a rehab facility in [place].  CA 125 elevated at 91.  CEA was 6.2. -I have discussed with the interventional radiologist, who felt that peritoneal biopsy is risky in her. - She had a paracentesis done on 03/06/2018 and 2.5 L was taken out.  Pathology was reported as adenocarcinoma.  I have called and talked to Dr. Claudette Laws, who is the pathologist, who felt that there is  enough cellblock to send for foundation 1 testing. -I discussed the results of the CT of the chest dated 02/25/2018 which did not show any evidence of metastatic disease. -She received 1 dose of carboplatin  AUC 2 on 03/12/2018. - Her eating has not improved in the last 3 weeks.  She remains to be very weak.  She has developed right upper quadrant pain for which she is taking oxycodone 1 tablet/day. -Today I talked to her again about best supportive care in the form of hospice.  She is agreeable to this option.  We will make a referral.  She will receive half liter of normal saline today as she is dehydrated.  2.  Malignant ascites: -Is on Lasix and spironolactone.  Last paracentesis on 04/02/2018, with 0.9 L of fluid removed.  3.  Family history: -Sister had metastatic breast cancer.  Another sister had cancer with lymph node in the neck removed.  Patient does not know the type of cancer.  Niece reportedly had colon cancer.       Orders placed this encounter:  No orders of the defined types were placed in this encounter.     Derek Jack, MD [place] [phone]

## 2018-06-14 DEATH — deceased

## 2019-06-26 IMAGING — US US ABDOMEN LIMITED
1 series · 3 of 3 positions shown · non-contrast
Comparison: 01/28/2018

CLINICAL DATA: Malignant ascites. Paracentesis 5 days ago with
continued full sensation, evaluate for therapeutic paracentesis.

EXAM:
LIMITED ABDOMEN ULTRASOUND FOR ASCITES
TECHNIQUE: Limited ultrasound survey for ascites was performed in all four
abdominal quadrants.

[Series 1: us abdomen limited · 0.25mm/px · 3 of 3 slices shown]
[im 1/3]
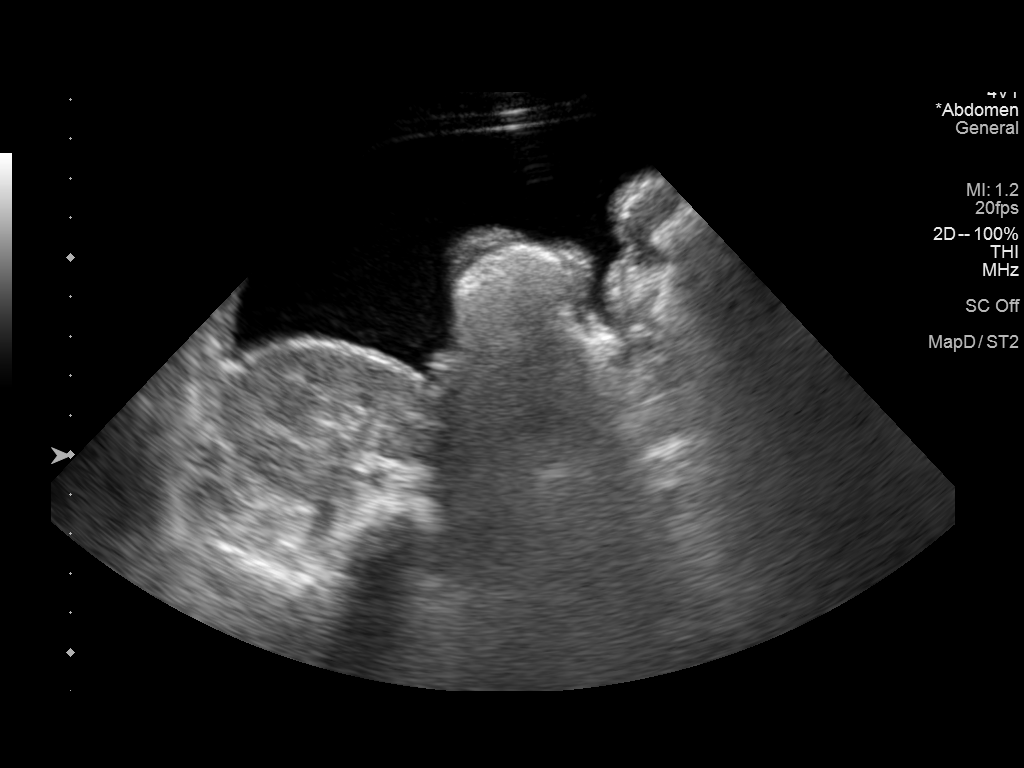
[im 2/3]
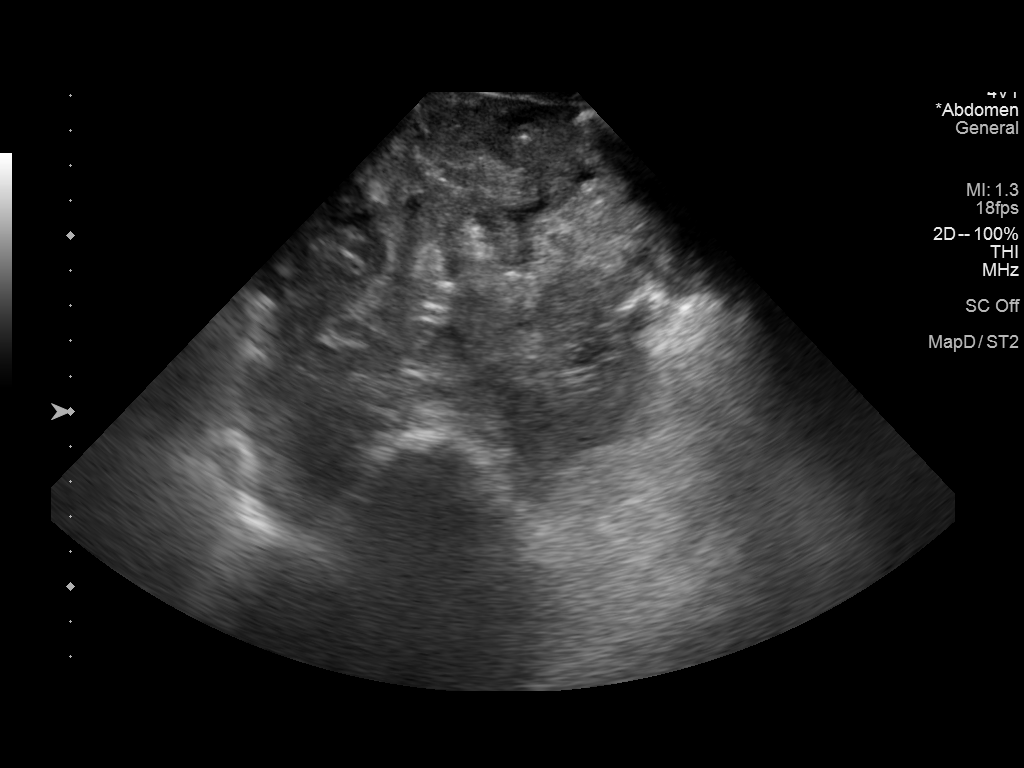
[im 3/3]
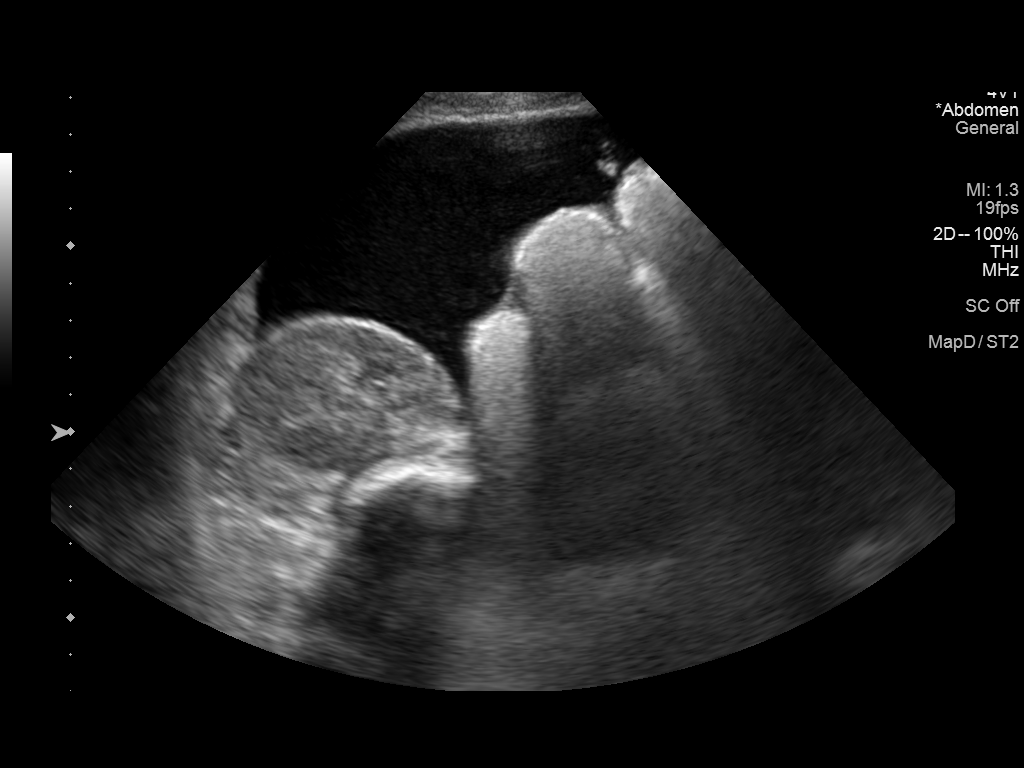

[3 of 3 positions shown; findings below may reference images not displayed]

FINDINGS: Overall small ascites, decreased from prior. When the fluid was
accumulated into the right abdomen it measures 3 cm in depth from
the liver tip to the right lower quadrant. 1 L or less is estimated.
Peritoneal nodularity is seen, consistent with prior CT findings and
history of peritoneal carcinomatosis. The findings and images were
shared with the patient. I am uncertain how much of the patient's
symptoms are related to fluid volume and she decided that the
benefits of drainage were less than the procedural risks.
IMPRESSION: Small volume ascites, estimated at 1 L or less. Relationship to
symptoms is uncertain and the patient deferred paracentesis at this
time.

## 2019-07-19 IMAGING — CT CT CHEST W/ CM
2 of 3 series · 15 of 36 positions shown, 18 images · IV contrast (omnipaque)
Comparison: Abdominal CT 01/26/2018.  No prior chest imaging.

CLINICAL DATA: Recently diagnosed with peritoneal carcinomatosis.

EXAM:
CT CHEST WITH CONTRAST
TECHNIQUE: Multidetector CT imaging of the chest was performed during
intravenous contrast administration.
CONTRAST:  75mL OMNIPAQUE IOHEXOL 300 MG/ML  SOLN

[Series 2: axial st · axial · 0.59mm/px · z∈[+1290,+1520]mm · 12 of 136 slices shown, 15 images]
[im 11/136  mediastinal]
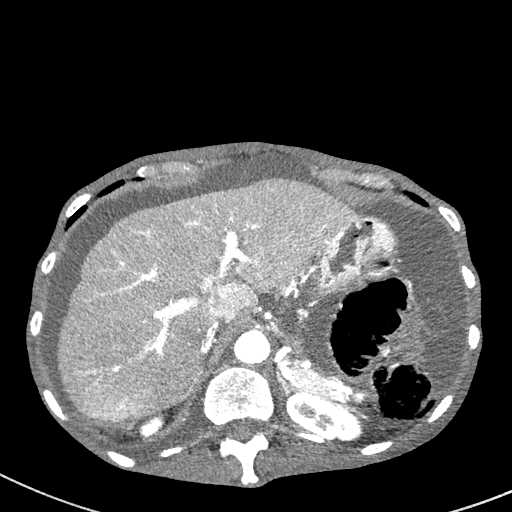
[im 11/136  lung]
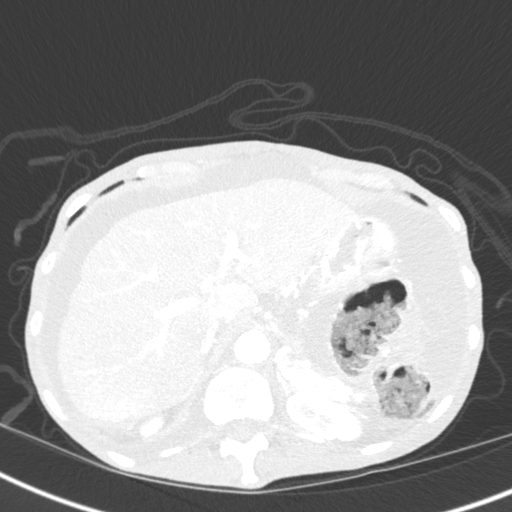
[im 21/136  lung]
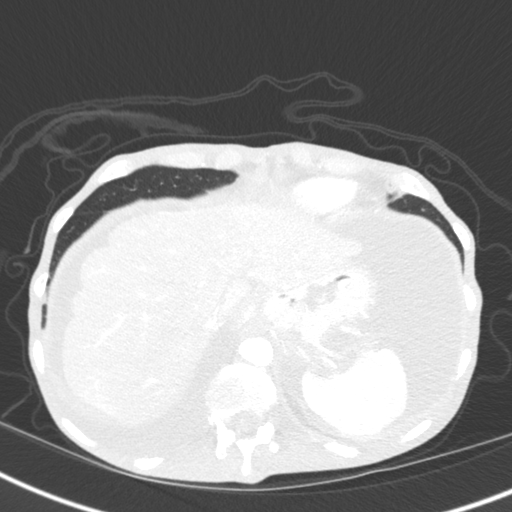
[im 31/136  lung]
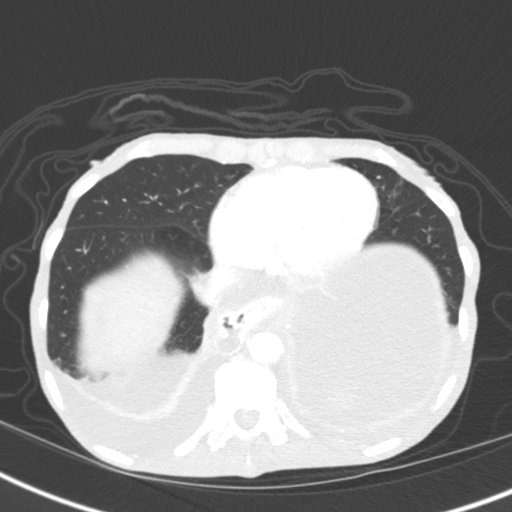
[im 41/136  lung]
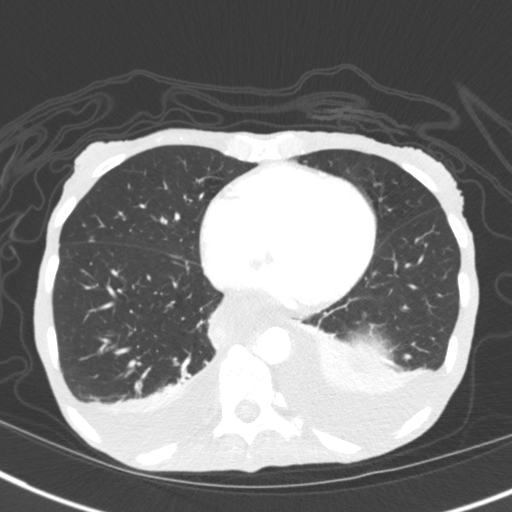
[im 51/136  mediastinal]
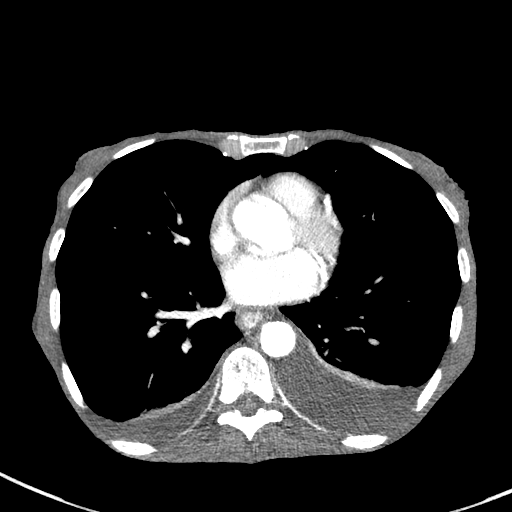
[im 51/136  lung]
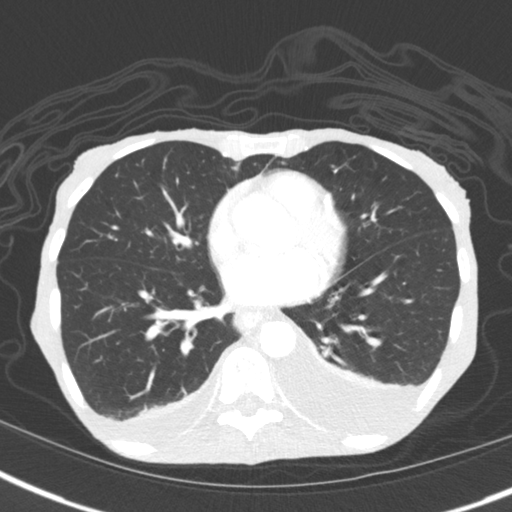
[im 61/136  lung]
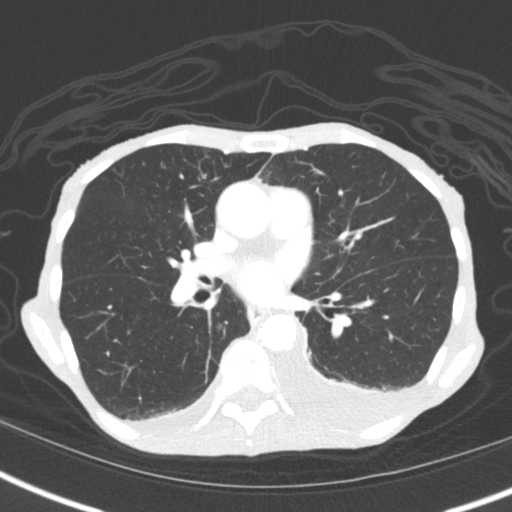
[im 76/136  lung]
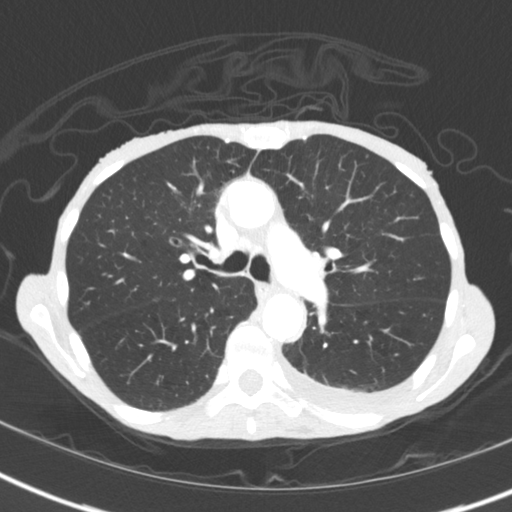
[im 86/136  lung]
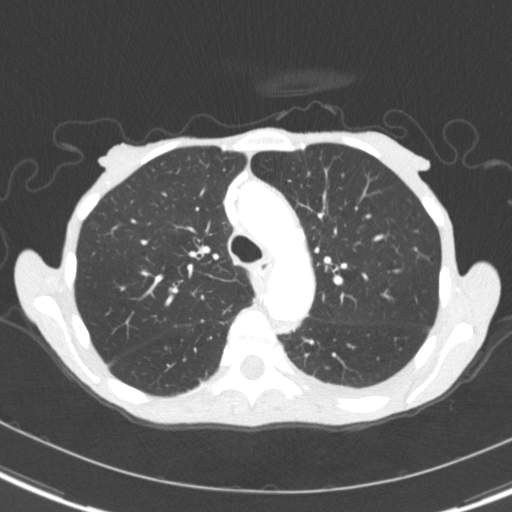
[im 96/136  mediastinal]
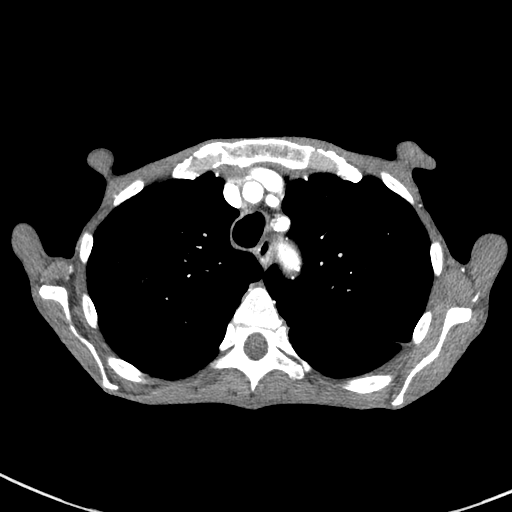
[im 96/136  lung]
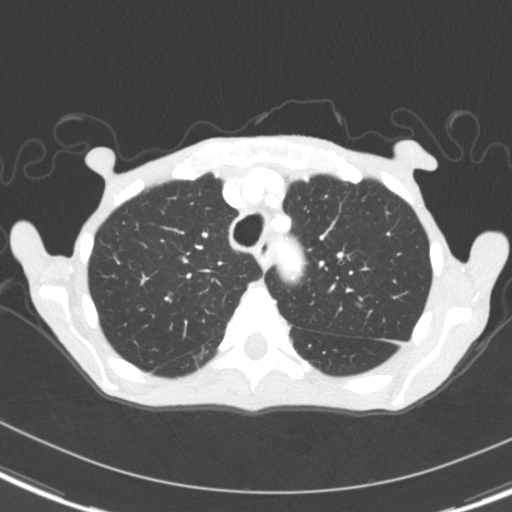
[im 106/136  lung]
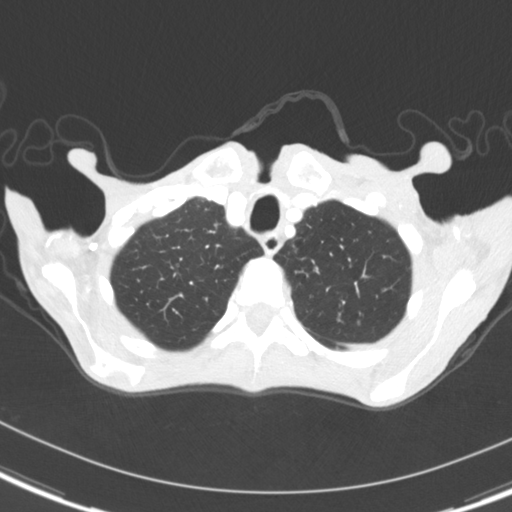
[im 116/136  lung]
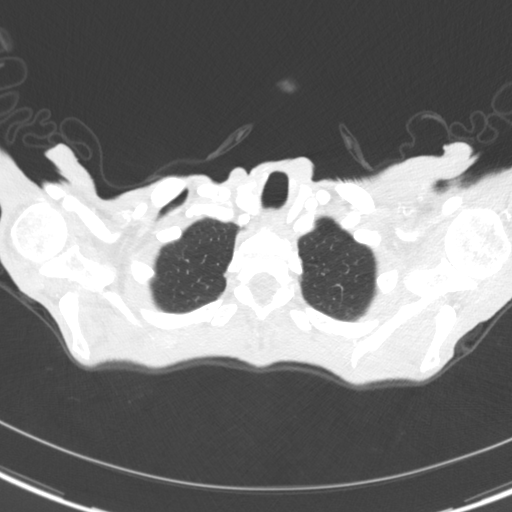
[im 126/136  lung]
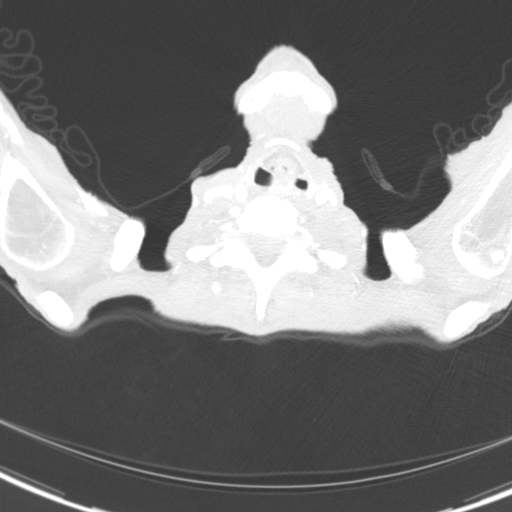

[Series 5: coronal · coronal · 0.54mm/px · 3 of 108 slices shown]
[im 22/108  lung]
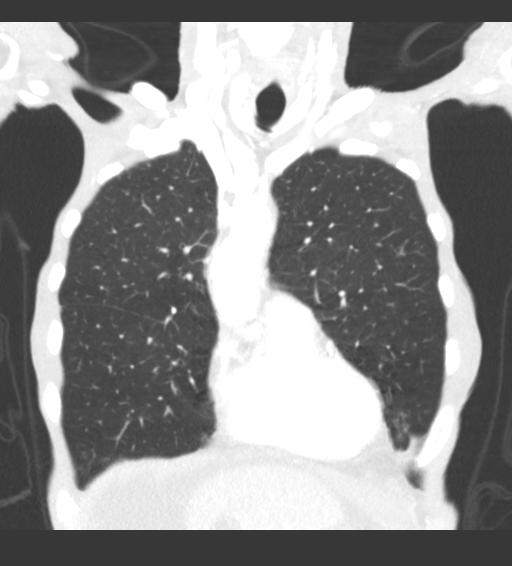
[im 43/108  lung]
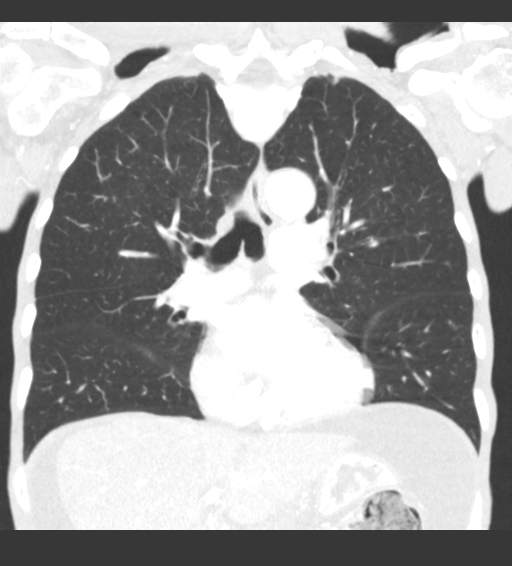
[im 65/108  lung]
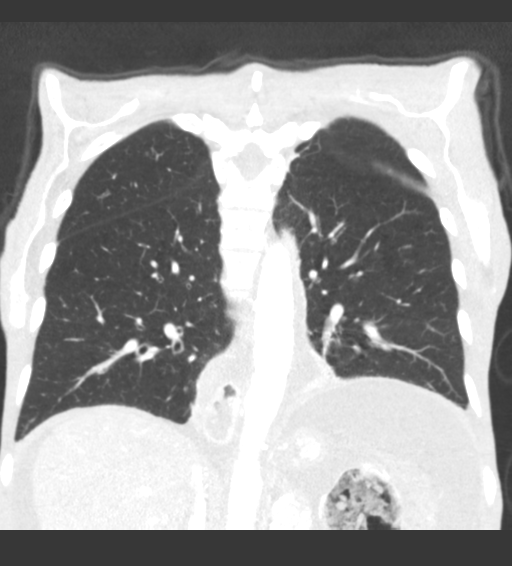

[15 of 36 positions shown; findings below may reference images not displayed]

FINDINGS: Cardiovascular: Advanced aortic and branch vessel atherosclerosis.
Extensive ulcerative plaque within the thoracic aorta. Normal heart
size, without pericardial effusion. Multivessel coronary artery
atherosclerosis. No central pulmonary embolism, on this
non-dedicated study.

Mediastinum/Nodes: Bilateral nonspecific hypoattenuating thyroid
nodules. No supraclavicular adenopathy. No mediastinal or hilar
adenopathy. A small hiatal hernia.

Lungs/Pleura: Increase in small left larger than right pleural
effusions. Development of dependent bibasilar atelectasis. Minimal
motion degradation inferiorly. Fluid or secretions in dependent
trachea.

Mild to moderate centrilobular emphysema.

Inferior right middle lobe 4 mm nodule on 112/4.

Mild lingular scarring with minimal bronchiectasis.

3 mm left apical pulmonary nodule on [DATE].

Upper Abdomen: Hepatic steatosis. Lateral segment left liver lobe
too small to characterize lesion. Normal imaged portions of the
spleen, pancreas, adrenal glands, kidneys. Redemonstration of
moderate volume abdominal ascites with soft tissue thickening about
the left upper quadrant omentum, including on 129/2.

Musculoskeletal: Developmental or postsurgical fusion of the C5-7
vertebral bodies. Trace C7-T1 anterolisthesis.
IMPRESSION: 1. No specific features of metastatic disease in the chest.
2. Aortic atherosclerosis ([id number].O), coronary artery
atherosclerosis and emphysema ([id number]).
3. Nonspecific bilateral tiny pulmonary nodules.
4. Increase in small bilateral pleural effusions.
5. Ascites and peritoneal carcinomatosis, as before.

## 2019-08-24 IMAGING — US US PARACENTESIS
1 series · 4 of 4 positions shown · non-contrast
Comparison: none

INDICATION: Peritoneal carcinomatosis with symptomatic ascites

[Series 1: us paracentesis · 4 of 4 slices shown]
[im 1/4]
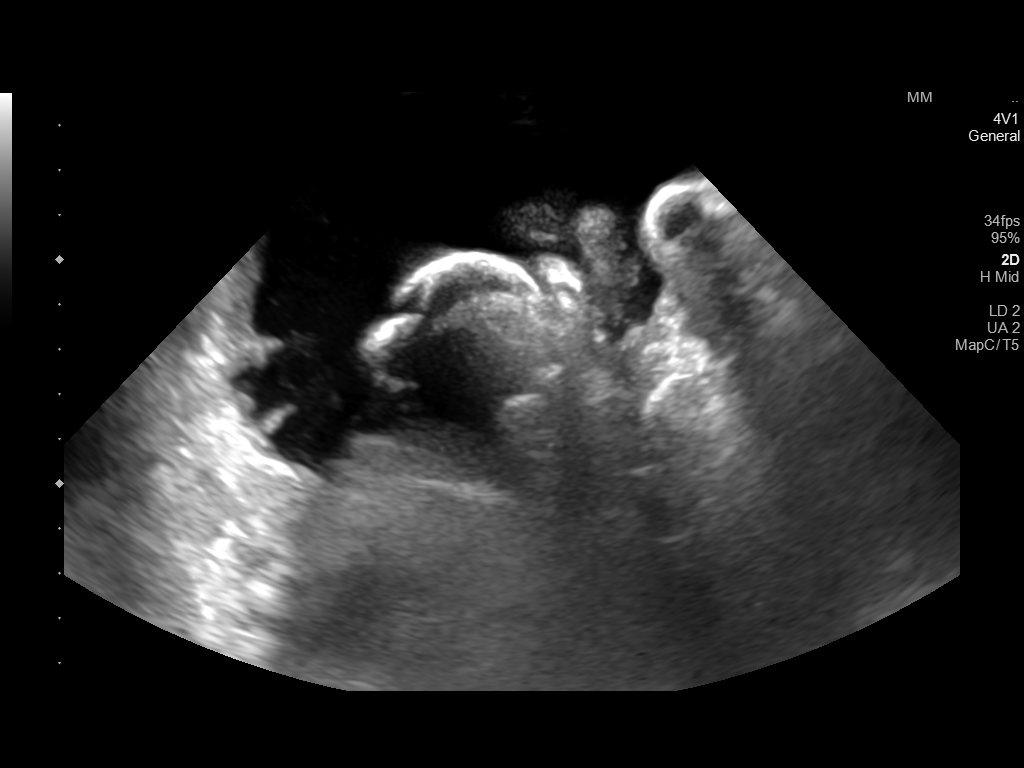
[im 2/4]
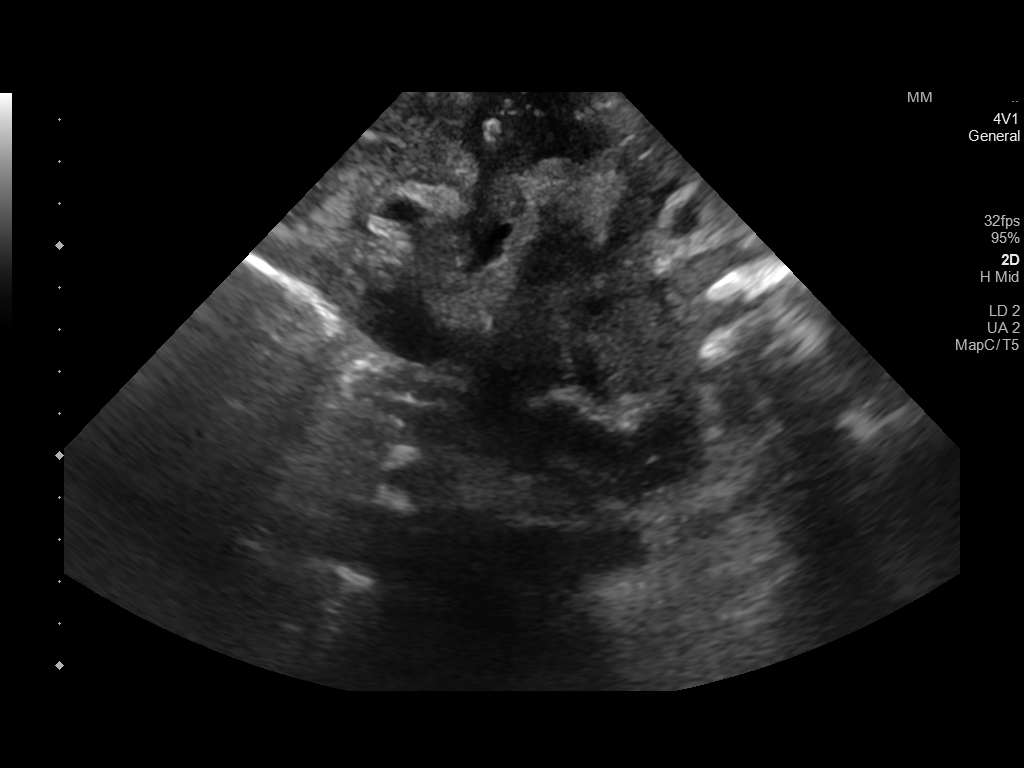
[im 3/4]
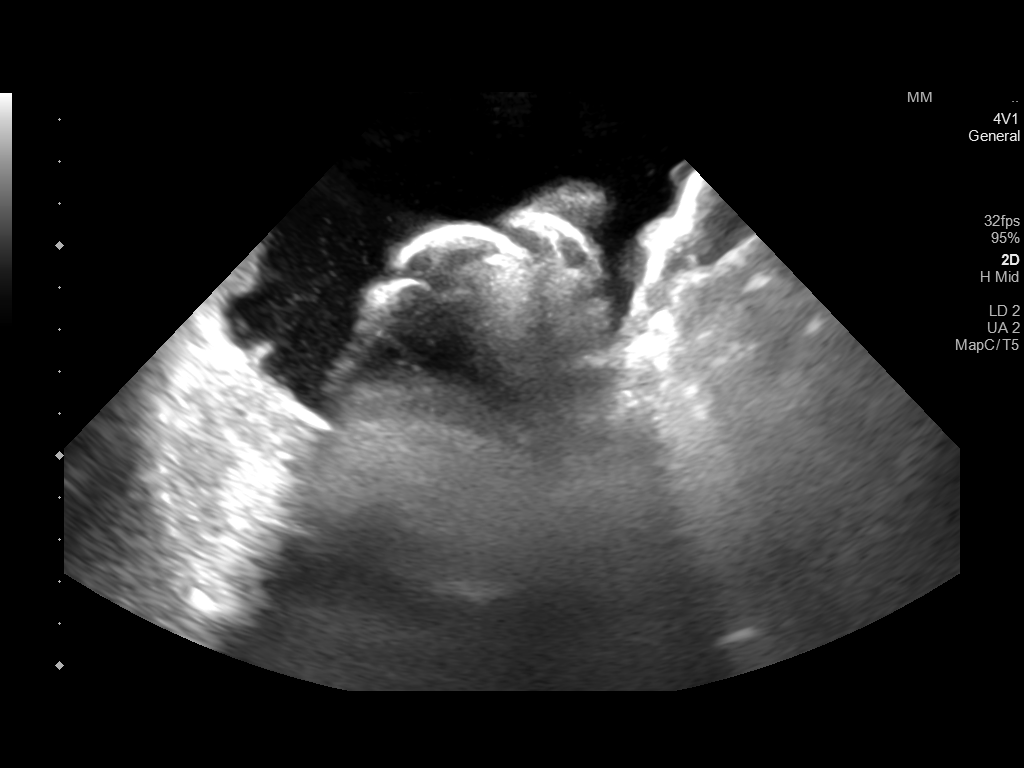
[im 4/4]
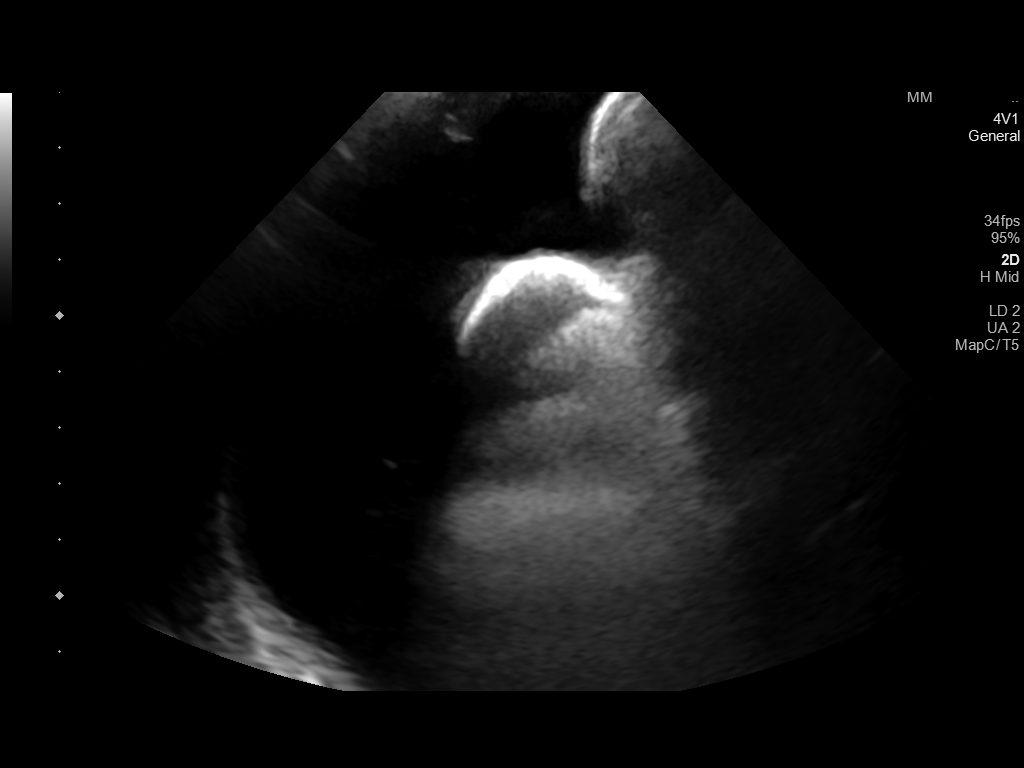

[4 of 4 positions shown; findings below may reference images not displayed]

EXAM:
ULTRASOUND GUIDED THERAPEUTIC PARACENTESIS

MEDICATIONS:
None.

COMPLICATIONS:
None immediate.

PROCEDURE:
Informed written consent was obtained from the patient after a
discussion of the risks, benefits and alternatives to treatment. A
timeout was performed prior to the initiation of the procedure. Last
week I assessed the patient and paracentesis was not performed due
to volume and minimal symptoms. Patient would like to proceed today,
believing it would help with her abdominal fullness.

Initial ultrasound scanning demonstrates a moderate amount of
ascites which could be accumulated within the right lower abdominal
quadrant. The right lower abdomen was prepped and draped in the
usual sterile fashion. 1% lidocaine with epinephrine was used for
local anesthesia.

Following this, a 19 gauge, 7-cm, Yueh catheter was introduced. An
ultrasound image was saved for documentation purposes. The
paracentesis was performed. The catheter was removed and a dressing
was applied. The patient tolerated the procedure well without
immediate post procedural complication.
FINDINGS: A total of approximately 0.9 L of yellowish clear fluid was removed.
Samples were sent to the laboratory as requested by the clinical
team.
IMPRESSION: Successful ultrasound-guided paracentesis yielding 0.9 liters of
peritoneal fluid.

## 2019-10-09 IMAGING — US US ABDOMEN LIMITED
1 series · 14 of 25 positions shown · non-contrast
Comparison: CT abdomen and pelvis 01/26/2018

CLINICAL DATA: Pancreatitis, 20 pounds weight loss

EXAM:
ULTRASOUND ABDOMEN LIMITED RIGHT UPPER QUADRANT

[Series 1: us abdomen limited · 0.19mm/px · 14 of 40 slices shown]
[im 1/40]
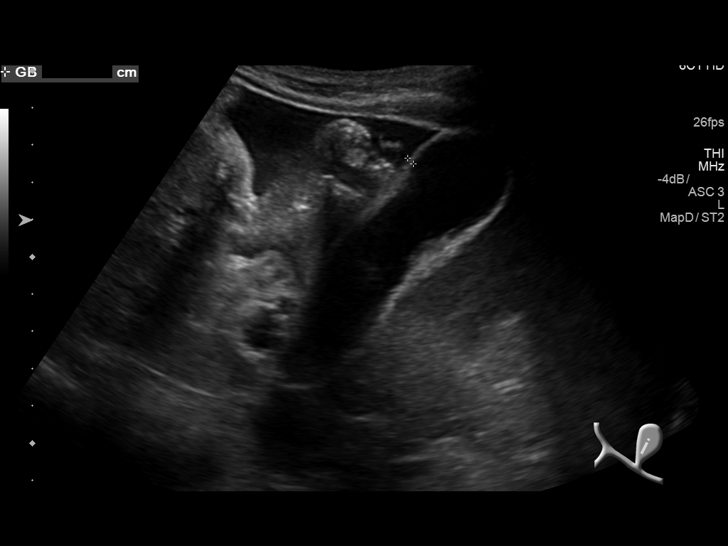
[im 4/40]
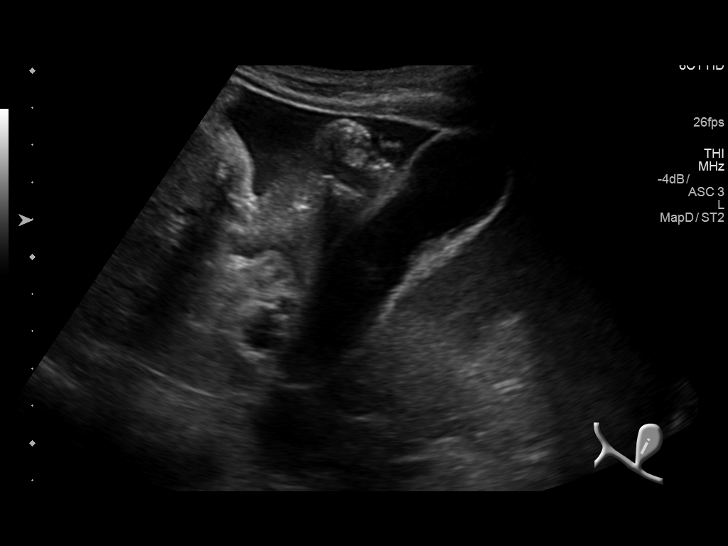
[im 7/40]
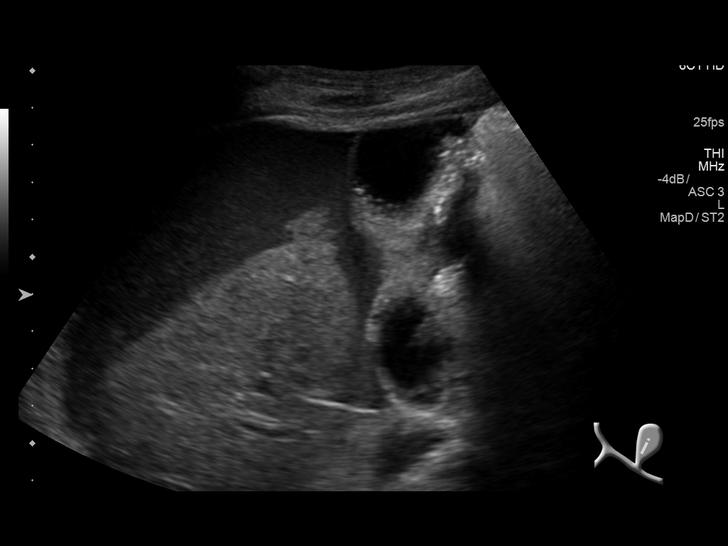
[im 10/40]
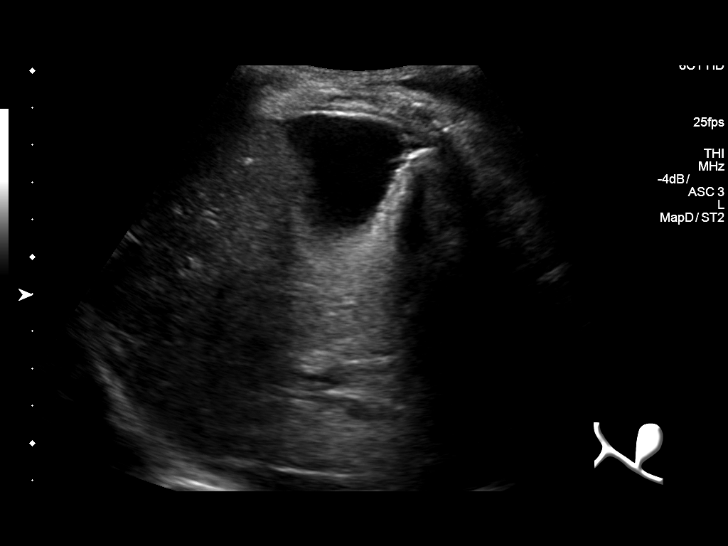
[im 14/40]
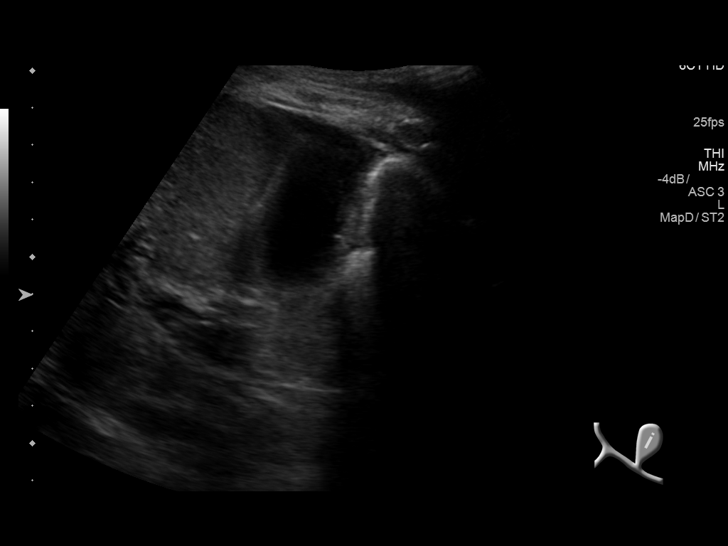
[im 15/40]
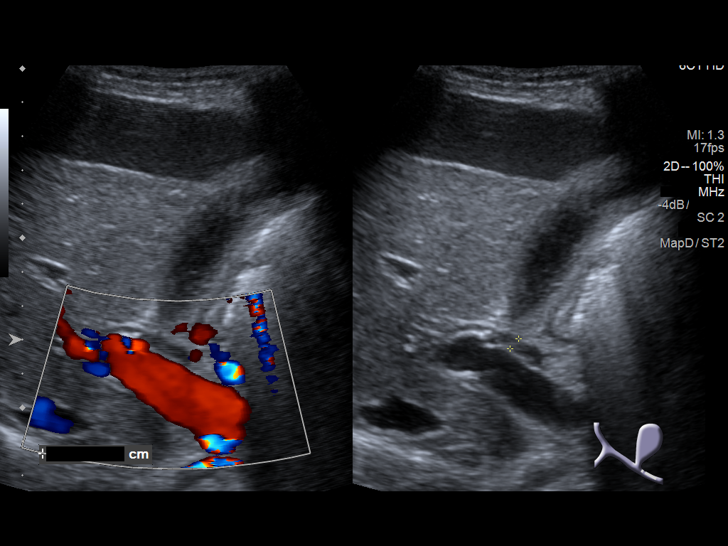
[im 18/40]
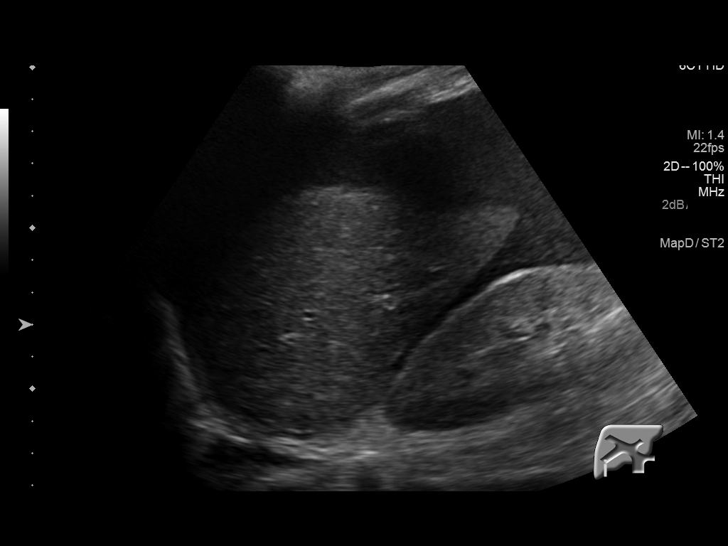
[im 22/40]
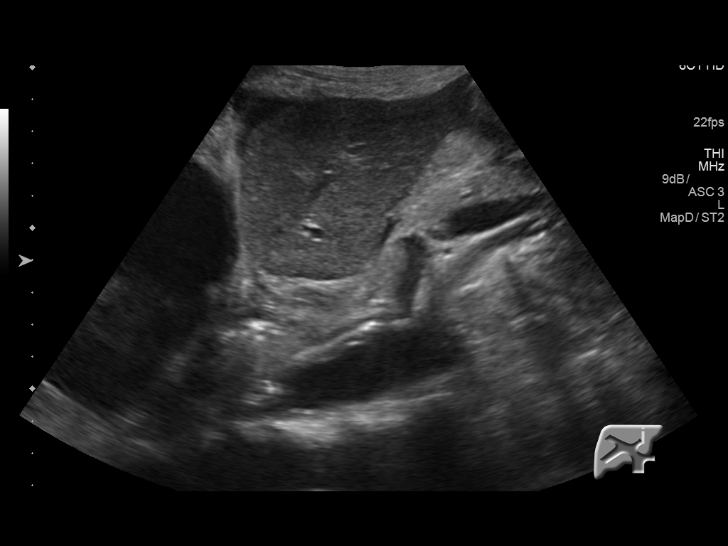
[im 25/40]
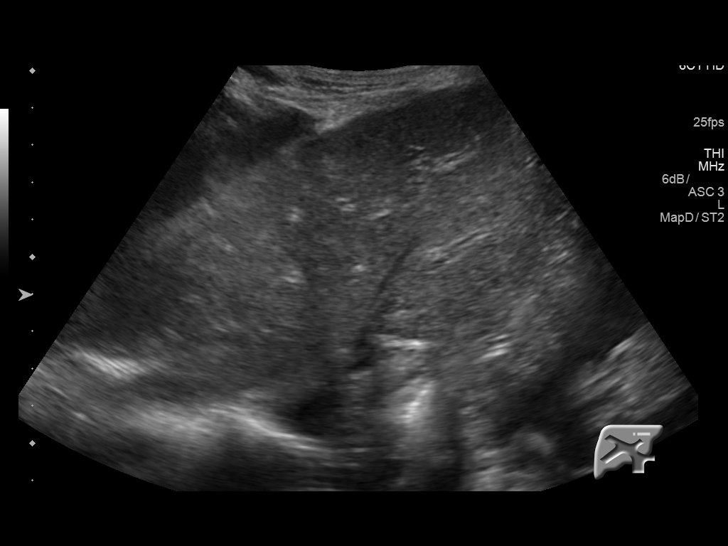
[im 27/40]
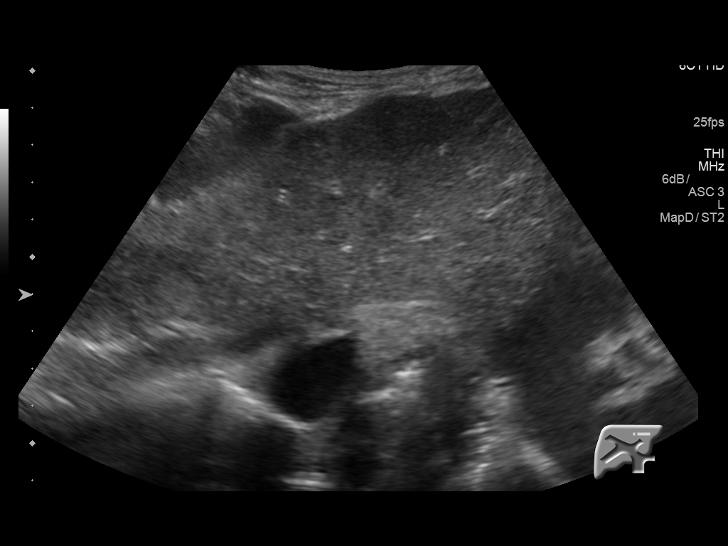
[im 30/40]
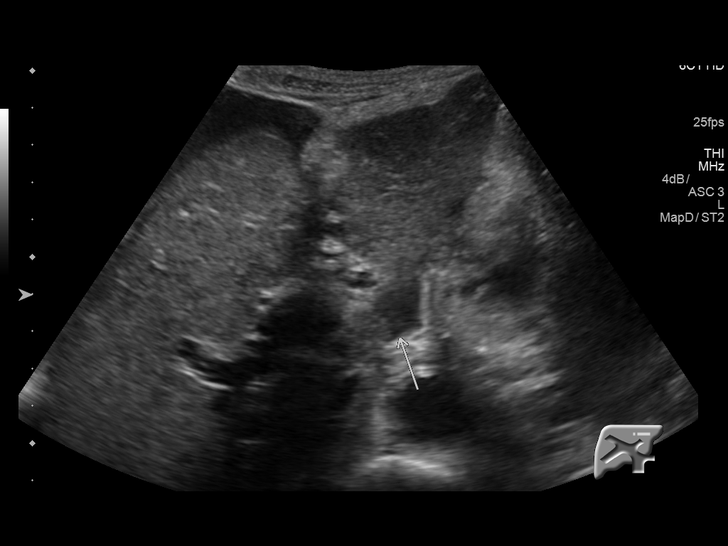
[im 33/40]
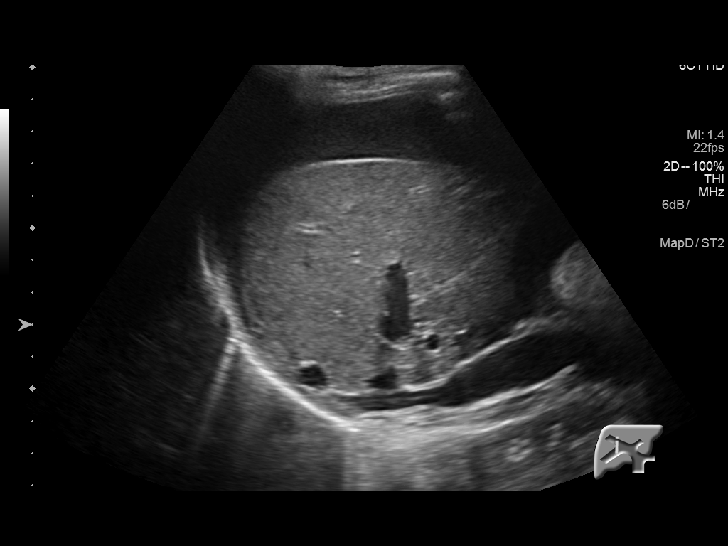
[im 36/40]
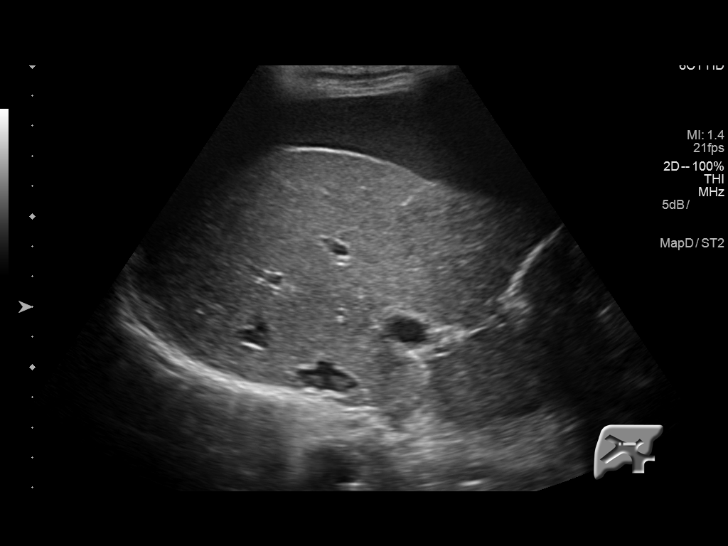
[im 40/40]
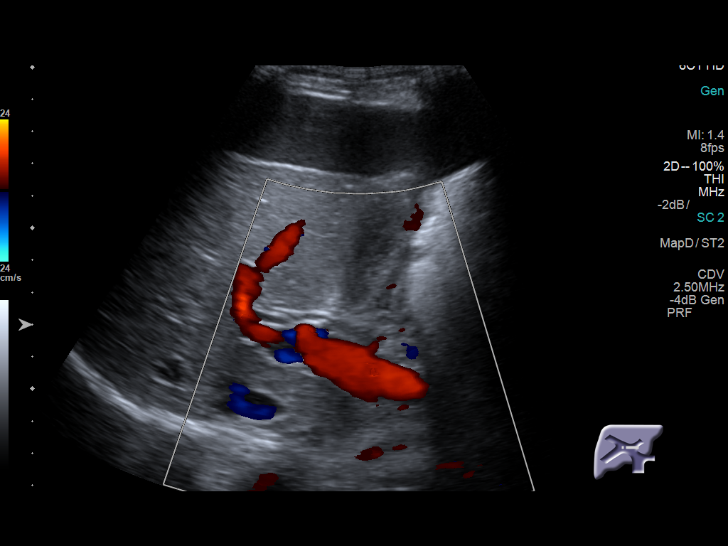

[14 of 25 positions shown; findings below may reference images not displayed]

FINDINGS: Gallbladder:

Upper normal gallbladder wall thickness. Tiny probable gallbladder
polyp 3 mm diameter. No definite shadowing calculi, wall thickening,
or sonographic Murphy sign.

Common bile duct:

Diameter: 4 mm diameter, normal

Liver:

Normal echogenicity. No definite hepatic mass or nodularity. Portal
vein is patent on color Doppler imaging with normal direction of
blood flow towards the liver.

Significant ascites.

Noted RIGHT pleural effusion.
IMPRESSION: Significant ascites and small RIGHT pleural effusion.

Tiny gallbladder polyp 3 mm diameter without definite gallstones or
biliary dilatation.
